# Patient Record
Sex: Female | Born: 1970 | Race: Black or African American | Hispanic: No | Smoking: Never smoker
Health system: Southern US, Community
[De-identification: ages and names within clinical notes are randomized; demographics above are authoritative.]

## PROBLEM LIST (undated history)

## (undated) DIAGNOSIS — O3440 Maternal care for other abnormalities of cervix, unspecified trimester: Secondary | ICD-10-CM

## (undated) DIAGNOSIS — IMO0002 Reserved for concepts with insufficient information to code with codable children: Secondary | ICD-10-CM

## (undated) DIAGNOSIS — Z9889 Other specified postprocedural states: Secondary | ICD-10-CM

## (undated) HISTORY — DX: Reserved for concepts with insufficient information to code with codable children: IMO0002

## (undated) HISTORY — DX: Other specified postprocedural states: Z98.890

## (undated) HISTORY — PX: EXCISION OF KELOID: SHX6267

## (undated) HISTORY — DX: Maternal care for other abnormalities of cervix, unspecified trimester: O34.40

---

## 1997-10-24 ENCOUNTER — Encounter (HOSPITAL_COMMUNITY): Admission: RE | Admit: 1997-10-24 | Discharge: 1997-11-04 | Payer: Self-pay | Admitting: *Deleted

## 1997-10-30 ENCOUNTER — Inpatient Hospital Stay (HOSPITAL_COMMUNITY): Admission: AD | Admit: 1997-10-30 | Discharge: 1997-10-30 | Payer: Self-pay | Admitting: *Deleted

## 1997-11-03 ENCOUNTER — Inpatient Hospital Stay (HOSPITAL_COMMUNITY): Admission: AD | Admit: 1997-11-03 | Discharge: 1997-11-05 | Payer: Self-pay | Admitting: *Deleted

## 2005-11-17 ENCOUNTER — Emergency Department (HOSPITAL_COMMUNITY): Admission: EM | Admit: 2005-11-17 | Discharge: 2005-11-17 | Payer: Self-pay | Admitting: Family Medicine

## 2007-01-19 ENCOUNTER — Emergency Department (HOSPITAL_COMMUNITY): Admission: EM | Admit: 2007-01-19 | Discharge: 2007-01-19 | Payer: Self-pay | Admitting: Emergency Medicine

## 2007-01-21 ENCOUNTER — Emergency Department (HOSPITAL_COMMUNITY): Admission: EM | Admit: 2007-01-21 | Discharge: 2007-01-21 | Payer: Self-pay | Admitting: Family Medicine

## 2007-05-19 ENCOUNTER — Ambulatory Visit: Payer: Self-pay | Admitting: Obstetrics & Gynecology

## 2007-06-15 DIAGNOSIS — O3440 Maternal care for other abnormalities of cervix, unspecified trimester: Secondary | ICD-10-CM

## 2007-06-15 DIAGNOSIS — Z9889 Other specified postprocedural states: Secondary | ICD-10-CM

## 2007-06-15 HISTORY — DX: Maternal care for other abnormalities of cervix, unspecified trimester: O34.40

## 2007-06-15 HISTORY — DX: Maternal care for other abnormalities of cervix, unspecified trimester: Z98.890

## 2007-07-06 ENCOUNTER — Ambulatory Visit: Payer: Self-pay | Admitting: Obstetrics and Gynecology

## 2007-07-06 ENCOUNTER — Other Ambulatory Visit: Admission: RE | Admit: 2007-07-06 | Discharge: 2007-07-06 | Payer: Self-pay | Admitting: Obstetrics & Gynecology

## 2007-08-16 ENCOUNTER — Ambulatory Visit: Payer: Self-pay | Admitting: Obstetrics & Gynecology

## 2008-08-09 ENCOUNTER — Encounter: Payer: Self-pay | Admitting: Obstetrics and Gynecology

## 2008-08-09 ENCOUNTER — Ambulatory Visit: Payer: Self-pay | Admitting: Obstetrics and Gynecology

## 2008-08-09 LAB — CONVERTED CEMR LAB
HCT: 37.8 % (ref 36.0–46.0)
MCV: 79.4 fL (ref 78.0–100.0)
Platelets: 308 10*3/uL (ref 150–400)
RBC: 4.76 M/uL (ref 3.87–5.11)
WBC: 6.4 10*3/uL (ref 4.0–10.5)

## 2008-08-15 ENCOUNTER — Ambulatory Visit (HOSPITAL_COMMUNITY): Admission: RE | Admit: 2008-08-15 | Discharge: 2008-08-15 | Payer: Self-pay | Admitting: Obstetrics and Gynecology

## 2010-04-17 ENCOUNTER — Ambulatory Visit: Payer: Self-pay | Admitting: Obstetrics & Gynecology

## 2010-07-05 ENCOUNTER — Encounter: Payer: Self-pay | Admitting: Obstetrics and Gynecology

## 2010-10-16 ENCOUNTER — Ambulatory Visit: Payer: Self-pay | Admitting: Advanced Practice Midwife

## 2010-10-27 NOTE — Group Therapy Note (Signed)
NAMEKEYUNDRA, FANT NO.:  [id number]   MEDICAL RECORD NO.:  [id number]          PATIENT TYPE:  WOC   LOCATION:  [facility]                   FACILITY:  [facility]   PHYSICIAN:  Argentina Donovan, MD        DATE OF BIRTH:  09/17/1970   DATE OF SERVICE:  07/06/2007                                  CLINIC NOTE   PROCEDURE:  Loop electrosurgical excision procedure conization of the  cervix.   PREOPERATIVE DIAGNOSIS:  Atypical Pap smear with unsatisfactory  colposcopy in a 40 year old woman, gravida 6, para 6-0-0-6.   With the patient in dorsolithotomy position, the Graves speculum was  inserted into the vagina and cervix dilated so that the cervix was in  the center of the field.  Lateral speculum was also inserted to protect  the lateral walls of the vagina which was quite redundant after 6  deliveries.  Four quadrant injections of 1 mL in each of the four  quadrants at 9, 12, 3, and 6 o'clock using 1% Xylocaine with 100,000  epinephrine was used.  A large loop was then used with a 50 blended one  current to remove the cone portion of the cervix.  The ball coagulator  was then used at 50 watts around the entire biopsy site and for  hemostasis followed by application of Monsel's solution and removal of  the speculum.  The patient tolerated the procedure well and was  discharged to home to return in 2 weeks for results.           ______________________________  Argentina Donovan, MD     PR/MEDQ  D:  07/06/2007  T:  07/06/2007  Job:  [id number]

## 2010-10-27 NOTE — Group Therapy Note (Signed)
Christine Jimenez, ROZNOWSKI NO.:  [id number]   MEDICAL RECORD NO.:  [id number]          PATIENT TYPE:  WOC   LOCATION:  [facility]                   FACILITY:  [facility]   PHYSICIAN:  Argentina Donovan, MD        DATE OF BIRTH:  10-07-1970   DATE OF SERVICE:  08/09/2008                                  CLINIC NOTE   The patient is a 40 year old gravida 6, para 6-0-0-6, the youngest child  age 30, in for her annual gynecological examination.  Complains of in  talking to her of shortness of breath.  She is also a keloid former.  She has had family history of two uncles within the last year who died  of lung cancer.   PHYSICAL EXAMINATION:  LUNGS:  Clear to auscultation and percussion.  HEART:  No murmur, normal sinus rhythm.  No gallop.  ABDOMEN:  Soft, flat, nontender.  No masses, no organomegaly.  EXTERNAL GENITALIA:  Normal.  BUS within normal limits.  Vagina is clean  and well-rugated.  Cervix clean, parous anterior lip of the cervix.  The  uterus is anterior, normal size, shape, and consistency.  Adnexa could  not be well-palpated.   IMPRESSION:  Normal gynecological examination.  The patient weighs 199  pounds, 5 feet 6 inches tall, with a blood pressure 116/72.  I am going  to get a CBC and a chest x-ray.  She sees her ENT doctor next week.  I  told her that will call her with results of these tests.  If anything is  abnormal, then refer to an internal medicine specialist.           ______________________________  Argentina Donovan, MD     PR/MEDQ  D:  08/09/2008  T:  08/09/2008  Job:  [id number]

## 2010-11-04 ENCOUNTER — Other Ambulatory Visit: Payer: Self-pay | Admitting: Family

## 2010-11-04 ENCOUNTER — Other Ambulatory Visit: Payer: Self-pay | Admitting: Obstetrics and Gynecology

## 2010-11-04 ENCOUNTER — Ambulatory Visit (INDEPENDENT_AMBULATORY_CARE_PROVIDER_SITE_OTHER): Payer: Medicaid Other | Admitting: Family Medicine

## 2010-11-04 DIAGNOSIS — Z124 Encounter for screening for malignant neoplasm of cervix: Secondary | ICD-10-CM

## 2010-11-04 DIAGNOSIS — Z1231 Encounter for screening mammogram for malignant neoplasm of breast: Secondary | ICD-10-CM

## 2010-11-05 NOTE — Group Therapy Note (Signed)
NAMELAQUINDA, MOLLER           ACCOUNT NO.:  [id number]  MEDICAL RECORD NO.:  [id number]           PATIENT TYPE:  A  LOCATION:  [facility]                   FACILITY:  [facility]  PHYSICIAN:  Sid Falcon, CNM  DATE OF BIRTH:  02-02-1971  DATE OF SERVICE:  11/04/2010                                 CLINIC NOTE  REASON FOR VISIT:  A 18-month repeat Pap smear.  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  Follow up pap in November 2011 negative, here for followup Pap.  Denies any problems or concerns at this time.  PHYSICAL EXAMINATION:  VITAL SIGNS:  Stable.  Temperature 97.7, pulse 80, blood pressure 127/82. PELVIS:  No abnormal lesions.  No abnormal discharge.  Cervix well visualized.  Pap smear obtained with broom and cytobrush, a negative cervical motion tenderness.  ASSESSMENT:  Repeat Pap smear.  PLAN:  The patient is to follow up as indicated by Pap smear results.     Sid Falcon, CNM    WM/MEDQ  D:  11/04/2010  T:  11/05/2010  Job:  [id number]

## 2010-11-23 ENCOUNTER — Ambulatory Visit (HOSPITAL_COMMUNITY): Payer: Medicaid Other

## 2011-07-07 ENCOUNTER — Ambulatory Visit (INDEPENDENT_AMBULATORY_CARE_PROVIDER_SITE_OTHER): Payer: Medicaid Other | Admitting: Physician Assistant

## 2011-07-07 ENCOUNTER — Encounter: Payer: Self-pay | Admitting: Physician Assistant

## 2011-07-07 VITALS — BP 112/80 | HR 89 | Temp 97.2°F | Resp 20 | Ht 66.0 in | Wt 204.6 lb

## 2011-07-07 DIAGNOSIS — Z87898 Personal history of other specified conditions: Secondary | ICD-10-CM | POA: Insufficient documentation

## 2011-07-07 DIAGNOSIS — Z8742 Personal history of other diseases of the female genital tract: Secondary | ICD-10-CM

## 2011-07-07 NOTE — Progress Notes (Signed)
Review of records revealed Negative excisional bx in 2009 and NIL paps since. Last pap 10/2010: NIL. No need for pap today. Will FU in May 2013 for annual and pap.

## 2011-07-20 ENCOUNTER — Other Ambulatory Visit: Payer: Self-pay | Admitting: Internal Medicine

## 2011-07-20 ENCOUNTER — Ambulatory Visit
Admission: RE | Admit: 2011-07-20 | Discharge: 2011-07-20 | Disposition: A | Discharge status: Home / Self Care | Payer: Medicaid Other | Source: Ambulatory Visit | Attending: Internal Medicine | Admitting: Internal Medicine

## 2011-07-20 DIAGNOSIS — R0602 Shortness of breath: Secondary | ICD-10-CM

## 2012-01-03 ENCOUNTER — Ambulatory Visit: Payer: Medicaid Other | Admitting: Advanced Practice Midwife

## 2012-01-26 ENCOUNTER — Ambulatory Visit (INDEPENDENT_AMBULATORY_CARE_PROVIDER_SITE_OTHER): Payer: Medicaid Other | Admitting: Advanced Practice Midwife

## 2012-01-26 ENCOUNTER — Encounter: Payer: Self-pay | Admitting: Advanced Practice Midwife

## 2012-01-26 ENCOUNTER — Other Ambulatory Visit (HOSPITAL_COMMUNITY)
Admission: RE | Admit: 2012-01-26 | Discharge: 2012-01-26 | Disposition: A | Discharge status: Home / Self Care | Payer: Medicaid Other | Source: Ambulatory Visit | Attending: Obstetrics and Gynecology | Admitting: Obstetrics and Gynecology

## 2012-01-26 VITALS — BP 113/79 | HR 86 | Temp 99.2°F | Ht 66.0 in | Wt 201.0 lb

## 2012-01-26 DIAGNOSIS — Z1151 Encounter for screening for human papillomavirus (HPV): Secondary | ICD-10-CM | POA: Insufficient documentation

## 2012-01-26 DIAGNOSIS — Z01419 Encounter for gynecological examination (general) (routine) without abnormal findings: Secondary | ICD-10-CM

## 2012-01-26 DIAGNOSIS — L910 Hypertrophic scar: Secondary | ICD-10-CM

## 2012-01-26 DIAGNOSIS — Z0000 Encounter for general adult medical examination without abnormal findings: Secondary | ICD-10-CM

## 2012-01-26 NOTE — Progress Notes (Deleted)
  Subjective:    Patient ID: Christine Jimenez, female    DOB: 07-May-1971, 41 y.o.   MRN: [id number]  HPI Pt presents for annual exam and PAP smear.    Reports skin changes over    Review of Systems     Objective:   Physical Exam        Assessment & Plan:

## 2012-01-26 NOTE — Assessment & Plan Note (Signed)
Profound over B tragus.

## 2012-01-26 NOTE — Progress Notes (Signed)
Subjective:    Christine Jimenez is a 41 y.o. female who presents for an annual exam and PAP smear.  She reports skin changes over the intertriginous areas between her breasts.  Reports no discomfort but discoloration.  She also reports worsening hearing and worsening of keloids over the tragus of her B ears.  These were previously seen by  ENT but >3 years ago.  Her hearing as worsened and reports not being able to hear conversations that are not directed directly at her.    The patient is sexually active. GYN screening history: last pap: was normal and with hx of LEEP but negative pathology. The patient  The patient participates in regular exercise: not asked. Has the patient ever been transfused or tattooed?: not asked. The patient reports that there is not domestic violence in her life.   Menstrual History: OB History    Grav Para Term Preterm Abortions TAB SAB Ect Mult Living   6 6 6       6       Patient's last menstrual period was 01/03/2012.    The following portions of the patient's history were reviewed and updated as appropriate: allergies, current medications, past family history, past medical history, past social history, past surgical history and problem list.  Review of Systems  Denies chest pain, shortness of breath, abnormal vaginal bleeding, no vision changes Otherwise PER HPI   Objective:    BP 113/79  Pulse 86  Temp 99.2 F (37.3 C) (Oral)  Ht 5\' 6"  (1.676 m)  Wt 201 lb (91.173 kg)  BMI 32.44 kg/m2  LMP 01/03/2012  General Appearance:    Alert, cooperative, no distress, appears stated age  Head:    Normocephalic, without obvious abnormality, atraumatic  Eyes:    PERRL, conjunctiva/corneas clear, EOM's intact, fundi    benign, both eyes  Ears:    Normal TM's difficult to visual due to obstructing Keloid from B tragus.  Hearing is grossly diminished but is able to differentiate between   Nose:   Nares normal, septum midline, mucosa normal, no drainage    or sinus  tenderness  Throat:   Lips, mucosa, and tongue normal; teeth and gums normal  Neck:   Supple, symmetrical, trachea midline, no adenopathy;    thyroid:  no enlargement/tenderness/nodules; no carotid   bruit or JVD  Back:     Symmetric, no curvature, ROM normal, no CVA tenderness  Lungs:     Clear to auscultation bilaterally, respirations unlabored  Chest Wall:    No tenderness or deformity   Heart:    Regular rate and rhythm, S1 and S2 normal, no murmur, rub   or gallop  Breast Exam:    No tenderness, masses, or nipple abnormality  Abdomen:     Soft, non-tender, bowel sounds active all four quadrants,    no masses, no organomegaly  Genitalia:    Normal female without lesion, discharge or tenderness, PAP collected  Rectal:    Normal tone, normal prostate, no masses or tenderness;   guaiac negative stool  Extremities:   Extremities normal, atraumatic, no cyanosis or edema  Pulses:   2+ and symmetric all extremities  Skin:   Discoloring over the intertriginous area between her breasts, consistent with Tinea Versicolor.  Multiple Keloid Scars with significant scar over manubrium.    Lymph nodes:   Cervical, supraclavicular, and axillary nodes normal  Neurologic:   CNII-XII intact, normal strength, sensation and reflexes    throughout   .  Assessment:    Healthy female exam.  1. Keloid formation 2. Tinea Versicolor   Plan:    Refer to ENT for evaluation of Keloid Selenium Sulfide + Eucerin Cream for Tinea Versicolor   Await pap smear results. Follow up as needed.   I was present for the exam and agree with above.  [place], [place] 01/26/2012

## 2012-01-26 NOTE — Patient Instructions (Signed)
It was great to see you today.  We are going to refer you back to ENT to have your ears re-evaluated and to have your hearing checked.    Remember to try the [place] Blue (Selenium Sulfide) shampoo to use over the areas of skin discoloring on your chest.  Apply in a lather and leave in place for 10 minutes.  Then rinse clean in shower.  You can use up to 7 days.  Apply a good moisturizing CREAM (in a tub/jar) such as Eucerin Cream 2-3 times per day.  Follow up in 1 year for your routine exam.  We will be in touch with you regarding the results of your PAP smear.

## 2012-05-04 ENCOUNTER — Ambulatory Visit
Admission: RE | Admit: 2012-05-04 | Discharge: 2012-05-04 | Disposition: A | Discharge status: Home / Self Care | Payer: Medicaid Other | Source: Ambulatory Visit | Attending: Internal Medicine | Admitting: Internal Medicine

## 2012-05-04 ENCOUNTER — Other Ambulatory Visit: Payer: Self-pay | Admitting: Internal Medicine

## 2012-05-04 DIAGNOSIS — M79605 Pain in left leg: Secondary | ICD-10-CM

## 2013-04-05 ENCOUNTER — Emergency Department (INDEPENDENT_AMBULATORY_CARE_PROVIDER_SITE_OTHER)
Admission: EM | Admit: 2013-04-05 | Discharge: 2013-04-05 | Disposition: A | Discharge status: Home / Self Care | Payer: Medicaid Other | Source: Home / Self Care

## 2013-04-05 ENCOUNTER — Encounter (HOSPITAL_COMMUNITY): Payer: Self-pay | Admitting: Emergency Medicine

## 2013-04-05 DIAGNOSIS — L910 Hypertrophic scar: Secondary | ICD-10-CM

## 2013-04-05 DIAGNOSIS — H6241 Otitis externa in other diseases classified elsewhere, right ear: Secondary | ICD-10-CM

## 2013-04-05 DIAGNOSIS — H60399 Other infective otitis externa, unspecified ear: Secondary | ICD-10-CM

## 2013-04-05 MED ORDER — CIPROFLOXACIN HCL 500 MG PO TABS: 500.0000 mg | ORAL_TABLET | Freq: Two times a day (BID) | ORAL | Status: DC

## 2013-04-05 MED ORDER — ACETIC ACID 2 % OT SOLN: 4.0000 [drp] | Freq: Four times a day (QID) | OTIC | Status: DC

## 2013-04-05 MED ORDER — POLYMYXIN B-TRIMETHOPRIM 10000-0.1 UNIT/ML-% OP SOLN: OPHTHALMIC | Status: DC

## 2013-04-05 NOTE — ED Notes (Signed)
Given cotton to place in right ear for drainage

## 2013-04-05 NOTE — ED Provider Notes (Signed)
CSN: [id number]     Arrival date & time 04/05/13  1326 History   First MD Initiated Contact with Patient 04/05/13 1346     Chief Complaint  Patient presents with  . Wound Infection   (Consider location/radiation/quality/duration/timing/severity/associated sxs/prior Treatment) HPI Comments: 42 year old female has 3 large keloid masses PAC together at the external auditory meatus and external ear. Occasionally it will bleed and is not unusual for her. Recently the more inferior lesion began to bleed followed by very malodorous purulent type discharge.   Past Medical History  Diagnosis Date  . 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 Hx LEEP (loop electrosurgical excision procedure), cervix, pregnancy    History reviewed. No pertinent past surgical history. Family History  Problem Relation Age of Onset  . Diabetes Mother   . Heart disease Mother   . Hypertension Mother    History  Substance Use Topics  . Smoking status: Never Smoker   . Smokeless tobacco: Not on file  . Alcohol Use: No   OB History   Grav Para Term Preterm Abortions TAB SAB Ect Mult Living   6 6 6       6      Review of Systems  Constitutional: Negative for fever, chills, activity change and fatigue.  HENT: Positive for ear discharge and ear pain. Negative for congestion, facial swelling, postnasal drip, rhinorrhea, sinus pressure, sore throat and trouble swallowing.   Respiratory: Negative.   Gastrointestinal: Negative.   All other systems reviewed and are negative.    Allergies  Review of patient's allergies indicates no known allergies.  Home Medications   Current Outpatient Rx  Name  Route  Sig  Dispense  Refill  . acetic acid (VOSOL) 2 % otic solution   Right Ear   Place 4 drops into the right ear 4 (four) times daily.   15 mL   0   . albuterol (PROVENTIL HFA;VENTOLIN HFA) 108 (90 BASE) MCG/ACT inhaler   Inhalation   Inhale 2 puffs into the lungs every 6 (six) hours as  needed. Patient unsure of dose         . ciprofloxacin (CIPRO) 500 MG tablet   Oral   Take 1 tablet (500 mg total) by mouth 2 (two) times daily.   28 tablet   0   . trimethoprim-polymyxin b (POLYTRIM) ophthalmic solution      4 drops into right ear every 4 hours   10 mL   0    BP 130/88  Pulse 98  Temp(Src) 98.7 F (37.1 C) (Oral)  Resp 18  SpO2 98%  LMP 04/03/2013 Physical Exam  Nursing note and vitals reviewed. Constitutional: She is oriented to person, place, and time. She appears well-developed and well-nourished. No distress.  HENT:  Left Ear: External ear normal.  Right TM with 3 the compacted keloid structures. The interior structure is erythematous and there is a purulent malodorous discharge originating between the middle and lower structures.  Eyes: Conjunctivae and EOM are normal.  Neck: Normal range of motion. Neck supple.  Pulmonary/Chest: Effort normal. No respiratory distress.  Neurological: She is alert and oriented to person, place, and time. She exhibits normal muscle tone.  Skin: Skin is warm and dry.    ED Course  Procedures (including critical care time) Labs Review Labs Reviewed - No data to display Imaging Review No results found.    MDM   1. Otitis externa in other diseases classified elsewhere, right ear  2. Keloid of skin      Polytrim ophthalmic drops place 4 drops in the right ear every 4 hours vosol solution 4 drops in the right ear 4 times a day Cipro 500 mg by mouth twice a day Call your ENT doctor for followup.  Hayden Rasmussen, NP 04/05/13 1440

## 2013-04-05 NOTE — ED Notes (Signed)
Patient has keloid to right ear.  Reports site was bleeding 3 days ago, now has clear drainage with an odor.

## 2013-04-05 NOTE — ED Provider Notes (Signed)
Medical screening examination/treatment/procedure(s) were performed by non-physician practitioner and as supervising physician I was immediately available for consultation/collaboration.  Leslee Home, M.D.  Reuben Likes, MD 04/05/13 [phone]

## 2013-06-28 ENCOUNTER — Other Ambulatory Visit (HOSPITAL_COMMUNITY)
Admission: RE | Admit: 2013-06-28 | Discharge: 2013-06-28 | Disposition: A | Discharge status: Home / Self Care | Payer: Medicaid Other | Source: Ambulatory Visit | Attending: Obstetrics & Gynecology | Admitting: Obstetrics & Gynecology

## 2013-06-28 ENCOUNTER — Ambulatory Visit (INDEPENDENT_AMBULATORY_CARE_PROVIDER_SITE_OTHER): Payer: Medicaid Other | Admitting: Obstetrics & Gynecology

## 2013-06-28 ENCOUNTER — Encounter: Payer: Self-pay | Admitting: Obstetrics & Gynecology

## 2013-06-28 VITALS — BP 133/72 | HR 94 | Temp 98.3°F | Ht 66.0 in | Wt 196.8 lb

## 2013-06-28 DIAGNOSIS — Z01419 Encounter for gynecological examination (general) (routine) without abnormal findings: Secondary | ICD-10-CM

## 2013-06-28 DIAGNOSIS — Z1151 Encounter for screening for human papillomavirus (HPV): Secondary | ICD-10-CM | POA: Insufficient documentation

## 2013-06-28 DIAGNOSIS — Z113 Encounter for screening for infections with a predominantly sexual mode of transmission: Secondary | ICD-10-CM | POA: Insufficient documentation

## 2013-06-28 DIAGNOSIS — Z309 Encounter for contraceptive management, unspecified: Secondary | ICD-10-CM

## 2013-06-28 NOTE — Progress Notes (Signed)
Pt has questions concerning diabetes testing, lft leg pain, feet swelling.

## 2013-06-28 NOTE — Progress Notes (Signed)
Subjective:     Patient ID: GEM CONKLE, female   DOB: April 21, 1971, 43 y.o.   MRN: [id number]  Gynecologic Exam  Patient presents for her annual exam. Patient complains of left thigh pain at night when laying on this leg. States this is an ache. Does not radiate. Only bothers her when laying down on this side. Occurs intermittently. Notes better when she gets off her left side. Denies injury to this area. States this has been improving since it began several months ago.  Patient notes she is not sexually active at this time. Last sexual activity was 3-4 months ago. She notes she walks for exercise. She denies domestic violence. Notes last period 06/18/13. Notes periods typically occur once monthly for 4-5 days with an occassional day of spotting on the 6th day.   Review of Systems denies chest pain, shortness of breath, and vision changes     Objective:   Physical Exam  Constitutional: She appears well-developed and well-nourished. No distress.  HENT:  Head: Normocephalic.  Mouth/Throat: Oropharynx is clear and moist.  Eyes: Conjunctivae are normal. Pupils are equal, round, and reactive to light.  Neck: Neck supple.  Cardiovascular: Normal rate, regular rhythm and normal heart sounds.   Pulmonary/Chest: Effort normal and breath sounds normal.  Abdominal: Soft. She exhibits no distension. There is no tenderness.  Genitourinary:  Normal external vagina, normal vaginal mucosa, no cervical abnormalities noted, no masses felt on bimanual exam  Musculoskeletal: She exhibits no edema.  Left thigh without swelling or erythema, no tenderness to palpation, full range of motion in LE  Lymphadenopathy:    She has no cervical adenopathy.  Neurological: She is alert.  Skin: Skin is warm and dry.  BP 133/72  Pulse 94  Temp(Src) 98.3 F (36.8 C)  Ht 5\' 6"  (1.676 m)  Wt 196 lb 12.8 oz (89.268 kg)  BMI 31.78 kg/m2  LMP 06/18/2013      Assessment:     Well Woman exam Left thigh pain     Plan:     Will f/u pap smear results. Thigh pain likely MSK related, though with no pain now and improving. Provided reassurance and to follow-up as needed for this issue.

## 2013-06-28 NOTE — Progress Notes (Signed)
Patient ID: Christine Jimenez, female   DOB: 04-Aug-1970, 43 y.o.   MRN: [id number] Attestation of Attending Supervision of Resident: Evaluation and management procedures were performed by the [facility] Resident under my supervision.  I have performed the exam on the patient as documented by the resident.  I have reviewed his note and chart, and I agree with the management and plan.  Lasandra Beech, M.D. 06/28/2013 3:10 PM

## 2013-06-28 NOTE — Patient Instructions (Signed)
Pap Test A Pap test checks the cells on the surface of your cervix. Your doctor will look for cell changes that are not normal, an infection, or cancer. If the cells no longer look normal, it is called dysplasia. Dysplasia can turn into cancer. Regular Pap tests are important to stop cancer from developing. BEFORE THE PROCEDURE  Ask your doctor when to schedule your Pap test. Timing the test around your period may be important.  Do not douche or have sex (intercourse) for 24 hours before the test.  Do not put creams on your vagina or use tampons for 24 hours before the test.  Go pee (urinate) just before the test. PROCEDURE  You will lie on an exam table with your feet in stirrups.  A warm metal or plastic tool (speculum) will be put in your vagina to open it up.  Your doctor will use a small, plastic brush or wooden spatula to take cells from your cervix.  The cells will be put in a lab container.  The cells will be checked under a microscope to see if they are normal or not. AFTER THE PROCEDURE Get your test results. If they are abnormal, you may need more tests. Document Released: 07/03/2010 Document Revised: 08/23/2011 Document Reviewed: 05/27/2011 Exit[employer] Patient Information 2014 [employer].  

## 2013-07-16 ENCOUNTER — Encounter: Payer: Self-pay | Admitting: *Deleted

## 2013-07-19 ENCOUNTER — Other Ambulatory Visit: Payer: Self-pay | Admitting: Obstetrics & Gynecology

## 2013-07-19 DIAGNOSIS — Z1231 Encounter for screening mammogram for malignant neoplasm of breast: Secondary | ICD-10-CM

## 2013-07-31 ENCOUNTER — Ambulatory Visit (HOSPITAL_COMMUNITY): Payer: Medicaid Other

## 2013-08-07 ENCOUNTER — Ambulatory Visit (HOSPITAL_COMMUNITY): Payer: Medicaid Other | Attending: Obstetrics & Gynecology

## 2013-10-23 ENCOUNTER — Ambulatory Visit (HOSPITAL_COMMUNITY)
Admission: RE | Admit: 2013-10-23 | Discharge: 2013-10-23 | Disposition: A | Discharge status: Home / Self Care | Payer: Medicaid Other | Source: Ambulatory Visit | Attending: Obstetrics & Gynecology | Admitting: Obstetrics & Gynecology

## 2013-10-23 DIAGNOSIS — Z1231 Encounter for screening mammogram for malignant neoplasm of breast: Secondary | ICD-10-CM

## 2014-04-15 ENCOUNTER — Encounter: Payer: Self-pay | Admitting: Obstetrics & Gynecology

## 2014-08-12 ENCOUNTER — Ambulatory Visit: Payer: Medicaid Other | Admitting: Obstetrics & Gynecology

## 2014-09-11 ENCOUNTER — Encounter: Payer: Self-pay | Admitting: Obstetrics & Gynecology

## 2014-09-11 ENCOUNTER — Other Ambulatory Visit (HOSPITAL_COMMUNITY)
Admission: RE | Admit: 2014-09-11 | Discharge: 2014-09-11 | Disposition: A | Discharge status: Home / Self Care | Payer: Medicaid Other | Source: Ambulatory Visit | Attending: Obstetrics & Gynecology | Admitting: Obstetrics & Gynecology

## 2014-09-11 ENCOUNTER — Ambulatory Visit (INDEPENDENT_AMBULATORY_CARE_PROVIDER_SITE_OTHER): Payer: Medicaid Other | Admitting: Obstetrics & Gynecology

## 2014-09-11 VITALS — BP 128/75 | HR 85 | Temp 99.2°F | Wt 202.9 lb

## 2014-09-11 DIAGNOSIS — Z3009 Encounter for other general counseling and advice on contraception: Secondary | ICD-10-CM

## 2014-09-11 DIAGNOSIS — Z01419 Encounter for gynecological examination (general) (routine) without abnormal findings: Secondary | ICD-10-CM | POA: Insufficient documentation

## 2014-09-11 DIAGNOSIS — Z1151 Encounter for screening for human papillomavirus (HPV): Secondary | ICD-10-CM | POA: Diagnosis not present

## 2014-09-11 DIAGNOSIS — Z124 Encounter for screening for malignant neoplasm of cervix: Secondary | ICD-10-CM

## 2014-09-11 NOTE — Progress Notes (Signed)
GYNECOLOGY CLINIC ANNUAL PREVENTATIVE CARE ENCOUNTER NOTE  Subjective:     Christine Jimenez is a 44 y.o. [id number] female here for a routine annual gynecologic exam and contraceptive counseling.  Current complaints: no GYN concerns.  Uses condoms as needed, not currently sexually active as she just ended a relationship.     Gynecologic History Patient's last menstrual period was 08/20/2014. Contraception: none Last Pap: 06/28/2013. Results were: normal Last mammogram: 10/23/13. Results were: normal  Obstetric History OB History  Gravida Para Term Preterm AB SAB TAB Ectopic Multiple Living  6 6 6       6     # Outcome Date GA Lbr Len/2nd Weight Sex Delivery Anes PTL Lv  6 Term           5 Term           4 Term           3 Term           2 Term           1 Term               History   Social History  . Marital Status: Divorced    Spouse Name: N/A  . Number of Children: N/A  . Years of Education: N/A   Occupational History  . Not on file.   Social History Main Topics  . Smoking status: Never Smoker   . Smokeless tobacco: Never Used  . Alcohol Use: No  . Drug Use: No  . Sexual Activity: No   Other Topics Concern  . Not on file   Social History Narrative    The following portions of the patient's history were reviewed and updated as appropriate: allergies, current medications, past family history, past medical history, past social history, past surgical history and problem list.  Review of Systems A comprehensive review of systems was negative except for: Ears, nose, mouth, throat, and face: positive for earaches and keloids   Objective:   BP 128/75 mmHg  Pulse 85  Temp(Src) 99.2 F (37.3 C) (Oral)  Wt 202 lb 14.4 oz (92.035 kg)  LMP 08/20/2014 GENERAL: Well-developed, well-nourished female in no acute distress.  HEENT: Normocephalic, atraumatic. Sclerae anicteric.  NECK: Supple. Normal thyroid.  LUNGS: Clear to auscultation bilaterally.  HEART: Regular  rate and rhythm. BREASTS: Symmetric in size. No masses, skin changes, nipple drainage, or lymphadenopathy. ABDOMEN: Soft, nontender, nondistended. No organomegaly. PELVIC: Normal external female genitalia. Vagina is pink and rugated.  Normal discharge. Normal cervix contour. Pap smear obtained. Uterus is normal in size. No adnexal mass or tenderness.  EXTREMITIES: No cyanosis, clubbing, or edema, 2+ distal pulses.   Assessment:   Annual gynecologic examination Contraceptive counseling   Plan:   Pap done, will follow up results and manage accordingly. Reviewed all forms of birth control options available including abstinence; over the counter/barrier methods; hormonal contraceptive medication including pill, patch, ring, injection,contraceptive implant; hormonal and nonhormonal IUDs; permanent sterilization options including vasectomy and the various tubal sterilization modalities. Risks and benefits reviewed.  Questions were answered.  Information was given to patient to review.  Patient will use condoms as needed for now for contraception and STI prevention. Mammogram scholarship application filled out by patient; they will call her to schedule study. Will follow up with PCP/ENT for ear issues Routine preventative health maintenance measures emphasized   Verita Schneiders, MD, [place] Attending [place] for [place] [place]  [facility]

## 2014-09-11 NOTE — Patient Instructions (Signed)
Contraception Choices Contraception (birth control) is the use of any methods or devices to prevent pregnancy. Below are some methods to help avoid pregnancy. HORMONAL METHODS   Contraceptive implant. This is a thin, plastic tube containing progesterone hormone. It does not contain estrogen hormone. Your health care provider inserts the tube in the inner part of the upper arm. The tube can remain in place for up to 3 years. After 3 years, the implant must be removed. The implant prevents the ovaries from releasing an egg (ovulation), thickens the cervical mucus to prevent sperm from entering the uterus, and thins the lining of the inside of the uterus.  Progesterone-only injections. These injections are given every 3 months by your health care provider to prevent pregnancy. This synthetic progesterone hormone stops the ovaries from releasing eggs. It also thickens cervical mucus and changes the uterine lining. This makes it harder for sperm to survive in the uterus.  Birth control pills. These pills contain estrogen and progesterone hormone. They work by preventing the ovaries from releasing eggs (ovulation). They also cause the cervical mucus to thicken, preventing the sperm from entering the uterus. Birth control pills are prescribed by a health care provider.Birth control pills can also be used to treat heavy periods.  Minipill. This type of birth control pill contains only the progesterone hormone. They are taken every day of each month and must be prescribed by your health care provider.  Birth control patch. The patch contains hormones similar to those in birth control pills. It must be changed once a week and is prescribed by a health care provider.  Vaginal ring. The ring contains hormones similar to those in birth control pills. It is left in the vagina for 3 weeks, removed for 1 week, and then a new one is put back in place. The patient must be comfortable inserting and removing the ring  from the vagina.A health care provider's prescription is necessary.  Emergency contraception. Emergency contraceptives prevent pregnancy after unprotected sexual intercourse. This pill can be taken right after sex or up to 5 days after unprotected sex. It is most effective the sooner you take the pills after having sexual intercourse. Most emergency contraceptive pills are available without a prescription. Check with your pharmacist. Do not use emergency contraception as your only form of birth control. BARRIER METHODS   Female condom. This is a thin sheath (latex or rubber) that is worn over the penis during sexual intercourse. It can be used with spermicide to increase effectiveness.  Female condom. This is a soft, loose-fitting sheath that is put into the vagina before sexual intercourse.  Diaphragm. This is a soft, latex, dome-shaped barrier that must be fitted by a health care provider. It is inserted into the vagina, along with a spermicidal jelly. It is inserted before intercourse. The diaphragm should be left in the vagina for 6 to 8 hours after intercourse.  Cervical cap. This is a round, soft, latex or plastic cup that fits over the cervix and must be fitted by a health care provider. The cap can be left in place for up to 48 hours after intercourse.  Sponge. This is a soft, circular piece of polyurethane foam. The sponge has spermicide in it. It is inserted into the vagina after wetting it and before sexual intercourse.  Spermicides. These are chemicals that kill or block sperm from entering the cervix and uterus. They come in the form of creams, jellies, suppositories, foam, or tablets. They do not require a   prescription. They are inserted into the vagina with an applicator before having sexual intercourse. The process must be repeated every time you have sexual intercourse. INTRAUTERINE CONTRACEPTION  Intrauterine device (IUD). This is a T-shaped device that is put in a woman's uterus  during a menstrual period to prevent pregnancy. There are 2 types:  Copper IUD. This type of IUD is wrapped in copper wire and is placed inside the uterus. Copper makes the uterus and fallopian tubes produce a fluid that kills sperm. It can stay in place for 10 years.  Hormone IUD. This type of IUD contains the hormone progestin (synthetic progesterone). The hormone thickens the cervical mucus and prevents sperm from entering the uterus, and it also thins the uterine lining to prevent implantation of a fertilized egg. The hormone can weaken or kill the sperm that get into the uterus. It can stay in place for 3-5 years, depending on which type of IUD is used. PERMANENT METHODS OF CONTRACEPTION  Female tubal ligation. This is when the woman's fallopian tubes are surgically sealed, tied, or blocked to prevent the egg from traveling to the uterus.  Hysteroscopic sterilization. This involves placing a small coil or insert into each fallopian tube. Your doctor uses a technique called hysteroscopy to do the procedure. The device causes scar tissue to form. This results in permanent blockage of the fallopian tubes, so the sperm cannot fertilize the egg. It takes about 3 months after the procedure for the tubes to become blocked. You must use another form of birth control for these 3 months.  Female sterilization. This is when the female has the tubes that carry sperm tied off (vasectomy).This blocks sperm from entering the vagina during sexual intercourse. After the procedure, the man can still ejaculate fluid (semen). NATURAL PLANNING METHODS  Natural family planning. This is not having sexual intercourse or using a barrier method (condom, diaphragm, cervical cap) on days the woman could become pregnant.  Calendar method. This is keeping track of the length of each menstrual cycle and identifying when you are fertile.  Ovulation method. This is avoiding sexual intercourse during ovulation.  Symptothermal  method. This is avoiding sexual intercourse during ovulation, using a thermometer and ovulation symptoms.  Post-ovulation method. This is timing sexual intercourse after you have ovulated. Regardless of which type or method of contraception you choose, it is important that you use condoms to protect against the transmission of sexually transmitted infections (STIs). Talk with your health care provider about which form of contraception is most appropriate for you. Document Released: 05/31/2005 Document Revised: 06/05/2013 Document Reviewed: 11/23/2012 [facility] Patient Information 2015 [place], [place]. This information is not intended to replace advice given to you by your health care provider. Make sure you discuss any questions you have with your health care provider.   Preventive Care for Adults A healthy lifestyle and preventive care can promote health and wellness. Preventive health guidelines for women include the following key practices.  A routine yearly physical is a good way to check with your health care provider about your health and preventive screening. It is a chance to share any concerns and updates on your health and to receive a thorough exam.  Visit your dentist for a routine exam and preventive care every 6 months. Brush your teeth twice a day and floss once a day. Good oral hygiene prevents tooth decay and gum disease.  The frequency of eye exams is based on your age, health, family medical history, use of contact lenses,  and other factors. Follow your health care provider's recommendations for frequency of eye exams.  Eat a healthy diet. Foods like vegetables, fruits, whole grains, low-fat dairy products, and lean protein foods contain the nutrients you need without too many calories. Decrease your intake of foods high in solid fats, added sugars, and salt. Eat the right amount of calories for you.Get information about a proper diet from your health care provider, if  necessary.  Regular physical exercise is one of the most important things you can do for your health. Most adults should get at least 150 minutes of moderate-intensity exercise (any activity that increases your heart rate and causes you to sweat) each week. In addition, most adults need muscle-strengthening exercises on 2 or more days a week.  Maintain a healthy weight. The body mass index (BMI) is a screening tool to identify possible weight problems. It provides an estimate of body fat based on height and weight. Your health care provider can find your BMI and can help you achieve or maintain a healthy weight.For adults 20 years and older:  A BMI below 18.5 is considered underweight.  A BMI of 18.5 to 24.9 is normal.  A BMI of 25 to 29.9 is considered overweight.  A BMI of 30 and above is considered obese.  Maintain normal blood lipids and cholesterol levels by exercising and minimizing your intake of saturated fat. Eat a balanced diet with plenty of fruit and vegetables. Blood tests for lipids and cholesterol should begin at age 18 and be repeated every 5 years. If your lipid or cholesterol levels are high, you are over 50, or you are at high risk for heart disease, you may need your cholesterol levels checked more frequently.Ongoing high lipid and cholesterol levels should be treated with medicines if diet and exercise are not working.  If you smoke, find out from your health care provider how to quit. If you do not use tobacco, do not start.  Lung cancer screening is recommended for adults aged 51-80 years who are at high risk for developing lung cancer because of a history of smoking. A yearly low-dose CT scan of the lungs is recommended for people who have at least a 30-pack-year history of smoking and are a current smoker or have quit within the past 15 years. A pack year of smoking is smoking an average of 1 pack of cigarettes a day for 1 year (for example: 1 pack a day for 30 years or 2  packs a day for 15 years). Yearly screening should continue until the smoker has stopped smoking for at least 15 years. Yearly screening should be stopped for people who develop a health problem that would prevent them from having lung cancer treatment.  If you are pregnant, do not drink alcohol. If you are breastfeeding, be very cautious about drinking alcohol. If you are not pregnant and choose to drink alcohol, do not have more than 1 drink per day. One drink is considered to be 12 ounces (355 mL) of beer, 5 ounces (148 mL) of wine, or 1.5 ounces (44 mL) of liquor.  Avoid use of street drugs. Do not share needles with anyone. Ask for help if you need support or instructions about stopping the use of drugs.  High blood pressure causes heart disease and increases the risk of stroke. Your blood pressure should be checked at least every 1 to 2 years. Ongoing high blood pressure should be treated with medicines if weight loss and exercise do  If you are 55-79 years old, ask your health care provider if you should take aspirin to prevent strokes.  Diabetes screening involves taking a blood sample to check your fasting blood sugar level. This should be done once every 3 years, after age 45, if you are within normal weight and without risk factors for diabetes. Testing should be considered at a younger age or be carried out more frequently if you are overweight and have at least 1 risk factor for diabetes.  Breast cancer screening is essential preventive care for women. You should practice "breast self-awareness." This means understanding the normal appearance and feel of your breasts and may include breast self-examination. Any changes detected, no matter how small, should be reported to a health care provider. Women in their 20s and 30s should have a clinical breast exam (CBE) by a health care provider as part of a regular health exam every 1 to 3 years. After age 40, women should have a CBE every  year. Starting at age 40, women should consider having a mammogram (breast X-ray test) every year. Women who have a family history of breast cancer should talk to their health care provider about genetic screening. Women at a high risk of breast cancer should talk to their health care providers about having an MRI and a mammogram every year.  Breast cancer gene (BRCA)-related cancer risk assessment is recommended for women who have family members with BRCA-related cancers. BRCA-related cancers include breast, ovarian, tubal, and peritoneal cancers. Having family members with these cancers may be associated with an increased risk for harmful changes (mutations) in the breast cancer genes BRCA1 and BRCA2. Results of the assessment will determine the need for genetic counseling and BRCA1 and BRCA2 testing.  Routine pelvic exams to screen for cancer are no longer recommended for nonpregnant women who are considered low risk for cancer of the pelvic organs (ovaries, uterus, and vagina) and who do not have symptoms. Ask your health care provider if a screening pelvic exam is right for you.  If you have had past treatment for cervical cancer or a condition that could lead to cancer, you need Pap tests and screening for cancer for at least 20 years after your treatment. If Pap tests have been discontinued, your risk factors (such as having a new sexual partner) need to be reassessed to determine if screening should be resumed. Some women have medical problems that increase the chance of getting cervical cancer. In these cases, your health care provider may recommend more frequent screening and Pap tests.  The HPV test is an additional test that may be used for cervical cancer screening. The HPV test looks for the virus that can cause the cell changes on the cervix. The cells collected during the Pap test can be tested for HPV. The HPV test could be used to screen women aged 30 years and older, and should be used in  women of any age who have unclear Pap test results. After the age of 30, women should have HPV testing at the same frequency as a Pap test.  Colorectal cancer can be detected and often prevented. Most routine colorectal cancer screening begins at the age of 50 years and continues through age 75 years. However, your health care provider may recommend screening at an earlier age if you have risk factors for colon cancer. On a yearly basis, your health care provider may provide home test kits to check for hidden blood in the stool. Use of a   Use of a small camera at the end of a tube, to directly examine the colon (sigmoidoscopy or colonoscopy), can detect the earliest forms of colorectal cancer. Talk to your health care provider about this at age 79, when routine screening begins. Direct exam of the colon should be repeated every 5-10 years through age 39 years, unless early forms of pre-cancerous polyps or small growths are found.  People who are at an increased risk for hepatitis B should be screened for this virus. You are considered at high risk for hepatitis B if:  You were born in a country where hepatitis B occurs often. Talk with your health care provider about which countries are considered high risk.  Your parents were born in a high-risk country and you have not received a shot to protect against hepatitis B (hepatitis B vaccine).  You have HIV or AIDS.  You use needles to inject street drugs.  You live with, or have sex with, someone who has hepatitis B.  You get hemodialysis treatment.  You take certain medicines for conditions like cancer, organ transplantation, and autoimmune conditions.  Hepatitis C blood testing is recommended for all people born from [birth year] through 1966 and any individual with known risks for hepatitis C.  Practice safe sex. Use condoms and avoid high-risk sexual practices to reduce the spread of sexually transmitted infections (STIs). STIs include gonorrhea, chlamydia,  syphilis, trichomonas, herpes, HPV, and human immunodeficiency virus (HIV). Herpes, HIV, and HPV are viral illnesses that have no cure. They can result in disability, cancer, and death.  You should be screened for sexually transmitted illnesses (STIs) including gonorrhea and chlamydia if:  You are sexually active and are younger than 24 years.  You are older than 24 years and your health care provider tells you that you are at risk for this type of infection.  Your sexual activity has changed since you were last screened and you are at an increased risk for chlamydia or gonorrhea. Ask your health care provider if you are at risk.  If you are at risk of being infected with HIV, it is recommended that you take a prescription medicine daily to prevent HIV infection. This is called preexposure prophylaxis (PrEP). You are considered at risk if:  You are a heterosexual woman, are sexually active, and are at increased risk for HIV infection.  You take drugs by injection.  You are sexually active with a partner who has HIV.  Talk with your health care provider about whether you are at high risk of being infected with HIV. If you choose to begin PrEP, you should first be tested for HIV. You should then be tested every 3 months for as long as you are taking PrEP.  Osteoporosis is a disease in which the bones lose minerals and strength with aging. This can result in serious bone fractures or breaks. The risk of osteoporosis can be identified using a bone density scan. Women ages 69 years and over and women at risk for fractures or osteoporosis should discuss screening with their health care providers. Ask your health care provider whether you should take a calcium supplement or vitamin D to reduce the rate of osteoporosis.  Menopause can be associated with physical symptoms and risks. Hormone replacement therapy is available to decrease symptoms and risks. You should talk to your health care provider  about whether hormone replacement therapy is right for you.  Use sunscreen. Apply sunscreen liberally and repeatedly throughout the day. You should seek shade  is shorter than you. Protect yourself by wearing long sleeves, pants, a wide-brimmed hat, and sunglasses year round, whenever you are outdoors.  Once a month, do a whole body skin exam, using a mirror to look at the skin on your back. Tell your health care provider of new moles, moles that have irregular borders, moles that are larger than a pencil eraser, or moles that have changed in shape or color.  Stay current with required vaccines (immunizations).  Influenza vaccine. All adults should be immunized every year.  Tetanus, diphtheria, and acellular pertussis (Td, Tdap) vaccine. Pregnant women should receive 1 dose of Tdap vaccine during each pregnancy. The dose should be obtained regardless of the length of time since the last dose. Immunization is preferred during the 27th-36th week of gestation. An adult who has not previously received Tdap or who does not know her vaccine status should receive 1 dose of Tdap. This initial dose should be followed by tetanus and diphtheria toxoids (Td) booster doses every 10 years. Adults with an unknown or incomplete history of completing a 3-dose immunization series with Td-containing vaccines should begin or complete a primary immunization series including a Tdap dose. Adults should receive a Td booster every 10 years.  Varicella vaccine. An adult without evidence of immunity to varicella should receive 2 doses or a second dose if she has previously received 1 dose. Pregnant females who do not have evidence of immunity should receive the first dose after pregnancy. This first dose should be obtained before leaving the health care facility. The second dose should be obtained 4-8 weeks after the first dose.  Human papillomavirus (HPV) vaccine. Females aged 13-26 years who have not received  the vaccine previously should obtain the 3-dose series. The vaccine is not recommended for use in pregnant females. However, pregnancy testing is not needed before receiving a dose. If a female is found to be pregnant after receiving a dose, no treatment is needed. In that case, the remaining doses should be delayed until after the pregnancy. Immunization is recommended for any person with an immunocompromised condition through the age of 26 years if she did not get any or all doses earlier. During the 3-dose series, the second dose should be obtained 4-8 weeks after the first dose. The third dose should be obtained 24 weeks after the first dose and 16 weeks after the second dose.  Zoster vaccine. One dose is recommended for adults aged 60 years or older unless certain conditions are present.  Measles, mumps, and rubella (MMR) vaccine. Adults born before 1957 generally are considered immune to measles and mumps. Adults born in 1957 or later should have 1 or more doses of MMR vaccine unless there is a contraindication to the vaccine or there is laboratory evidence of immunity to each of the three diseases. A routine second dose of MMR vaccine should be obtained at least 28 days after the first dose for students attending postsecondary schools, health care workers, or international travelers. People who received inactivated measles vaccine or an unknown type of measles vaccine during 1963-1967 should receive 2 doses of MMR vaccine. People who received inactivated mumps vaccine or an unknown type of mumps vaccine before 1979 and are at high risk for mumps infection should consider immunization with 2 doses of MMR vaccine. For females of childbearing age, rubella immunity should be determined. If there is no evidence of immunity, females who are not pregnant should be vaccinated. If there is no evidence of immunity, females who   are pregnant should delay immunization until after pregnancy. Unvaccinated health care  workers born before 1957 who lack laboratory evidence of measles, mumps, or rubella immunity or laboratory confirmation of disease should consider measles and mumps immunization with 2 doses of MMR vaccine or rubella immunization with 1 dose of MMR vaccine.  Pneumococcal 13-valent conjugate (PCV13) vaccine. When indicated, a person who is uncertain of her immunization history and has no record of immunization should receive the PCV13 vaccine. An adult aged 19 years or older who has certain medical conditions and has not been previously immunized should receive 1 dose of PCV13 vaccine. This PCV13 should be followed with a dose of pneumococcal polysaccharide (PPSV23) vaccine. The PPSV23 vaccine dose should be obtained at least 8 weeks after the dose of PCV13 vaccine. An adult aged 19 years or older who has certain medical conditions and previously received 1 or more doses of PPSV23 vaccine should receive 1 dose of PCV13. The PCV13 vaccine dose should be obtained 1 or more years after the last PPSV23 vaccine dose.  Pneumococcal polysaccharide (PPSV23) vaccine. When PCV13 is also indicated, PCV13 should be obtained first. All adults aged 65 years and older should be immunized. An adult younger than age 65 years who has certain medical conditions should be immunized. Any person who resides in a nursing home or long-term care facility should be immunized. An adult smoker should be immunized. People with an immunocompromised condition and certain other conditions should receive both PCV13 and PPSV23 vaccines. People with human immunodeficiency virus (HIV) infection should be immunized as soon as possible after diagnosis. Immunization during chemotherapy or radiation therapy should be avoided. Routine use of PPSV23 vaccine is not recommended for American Indians, [place] Natives, or people younger than 65 years unless there are medical conditions that require PPSV23 vaccine. When indicated, people who have unknown  immunization and have no record of immunization should receive PPSV23 vaccine. One-time revaccination 5 years after the first dose of PPSV23 is recommended for people aged 19-64 years who have chronic kidney failure, nephrotic syndrome, asplenia, or immunocompromised conditions. People who received 1-2 doses of PPSV23 before age 65 years should receive another dose of PPSV23 vaccine at age 65 years or later if at least 5 years have passed since the previous dose. Doses of PPSV23 are not needed for people immunized with PPSV23 at or after age 65 years.  Meningococcal vaccine. Adults with asplenia or persistent complement component deficiencies should receive 2 doses of quadrivalent meningococcal conjugate (MenACWY-D) vaccine. The doses should be obtained at least 2 months apart. Microbiologists working with certain meningococcal bacteria, military recruits, people at risk during an outbreak, and people who travel to or live in countries with a high rate of meningitis should be immunized. A first-year college student up through age 21 years who is living in a residence hall should receive a dose if she did not receive a dose on or after her 16th birthday. Adults who have certain high-risk conditions should receive one or more doses of vaccine.  Hepatitis A vaccine. Adults who wish to be protected from this disease, have certain high-risk conditions, work with hepatitis A-infected animals, work in hepatitis A research labs, or travel to or work in countries with a high rate of hepatitis A should be immunized. Adults who were previously unvaccinated and who anticipate close contact with an international adoptee during the first 60 days after arrival in the [place] from a country with a high rate of hepatitis A should be   immunized.  Hepatitis B vaccine. Adults who wish to be protected from this disease, have certain high-risk conditions, may be exposed to blood or other infectious body fluids, are household  contacts or sex partners of hepatitis B positive people, are clients or workers in certain care facilities, or travel to or work in countries with a high rate of hepatitis B should be immunized.  Haemophilus influenzae type b (Hib) vaccine. A previously unvaccinated person with asplenia or sickle cell disease or having a scheduled splenectomy should receive 1 dose of Hib vaccine. Regardless of previous immunization, a recipient of a hematopoietic stem cell transplant should receive a 3-dose series 6-12 months after her successful transplant. Hib vaccine is not recommended for adults with HIV infection. Preventive Services / Frequency Ages 19 to 39 years  Blood pressure check.** / Every 1 to 2 years.  Lipid and cholesterol check.** / Every 5 years beginning at age 20.  Clinical breast exam.** / Every 3 years for women in their 20s and 30s.  BRCA-related cancer risk assessment.** / For women who have family members with a BRCA-related cancer (breast, ovarian, tubal, or peritoneal cancers).  Pap test.** / Every 2 years from ages 21 through 29. Every 3 years starting at age 30 through age 65 or 70 with a history of 3 consecutive normal Pap tests.  HPV screening.** / Every 3 years from ages 30 through ages 65 to 70 with a history of 3 consecutive normal Pap tests.  Hepatitis C blood test.** / For any individual with known risks for hepatitis C.  Skin self-exam. / Monthly.  Influenza vaccine. / Every year.  Tetanus, diphtheria, and acellular pertussis (Tdap, Td) vaccine.** / Consult your health care provider. Pregnant women should receive 1 dose of Tdap vaccine during each pregnancy. 1 dose of Td every 10 years.  Varicella vaccine.** / Consult your health care provider. Pregnant females who do not have evidence of immunity should receive the first dose after pregnancy.  HPV vaccine. / 3 doses over 6 months, if 26 and younger. The vaccine is not recommended for use in pregnant females. However,  pregnancy testing is not needed before receiving a dose.  Measles, mumps, rubella (MMR) vaccine.** / You need at least 1 dose of MMR if you were born in 1957 or later. You may also need a 2nd dose. For females of childbearing age, rubella immunity should be determined. If there is no evidence of immunity, females who are not pregnant should be vaccinated. If there is no evidence of immunity, females who are pregnant should delay immunization until after pregnancy.  Pneumococcal 13-valent conjugate (PCV13) vaccine.** / Consult your health care provider.  Pneumococcal polysaccharide (PPSV23) vaccine.** / 1 to 2 doses if you smoke cigarettes or if you have certain conditions.  Meningococcal vaccine.** / 1 dose if you are age 19 to 21 years and a first-year college student living in a residence hall, or have one of several medical conditions, you need to get vaccinated against meningococcal disease. You may also need additional booster doses.  Hepatitis A vaccine.** / Consult your health care provider.  Hepatitis B vaccine.** / Consult your health care provider.  Haemophilus influenzae type b (Hib) vaccine.** / Consult your health care provider. Ages 40 to 64 years  Blood pressure check.** / Every 1 to 2 years.  Lipid and cholesterol check.** / Every 5 years beginning at age 20 years.  Lung cancer screening. / Every year if you are aged 55-80 years and have a   30-pack-year history of smoking and currently smoke or have quit within the past 15 years. Yearly screening is stopped once you have quit smoking for at least 15 years or develop a health problem that would prevent you from having lung cancer treatment.  Clinical breast exam.** / Every year after age 40 years.  BRCA-related cancer risk assessment.** / For women who have family members with a BRCA-related cancer (breast, ovarian, tubal, or peritoneal cancers).  Mammogram.** / Every year beginning at age 40 years and continuing for as long  as you are in good health. Consult with your health care provider.  Pap test.** / Every 3 years starting at age 30 years through age 65 or 70 years with a history of 3 consecutive normal Pap tests.  HPV screening.** / Every 3 years from ages 30 years through ages 65 to 70 years with a history of 3 consecutive normal Pap tests.  Fecal occult blood test (FOBT) of stool. / Every year beginning at age 50 years and continuing until age 75 years. You may not need to do this test if you get a colonoscopy every 10 years.  Flexible sigmoidoscopy or colonoscopy.** / Every 5 years for a flexible sigmoidoscopy or every 10 years for a colonoscopy beginning at age 50 years and continuing until age 75 years.  Hepatitis C blood test.** / For all people born from 1945 through 1965 and any individual with known risks for hepatitis C.  Skin self-exam. / Monthly.  Influenza vaccine. / Every year.  Tetanus, diphtheria, and acellular pertussis (Tdap/Td) vaccine.** / Consult your health care provider. Pregnant women should receive 1 dose of Tdap vaccine during each pregnancy. 1 dose of Td every 10 years.  Varicella vaccine.** / Consult your health care provider. Pregnant females who do not have evidence of immunity should receive the first dose after pregnancy.  Zoster vaccine.** / 1 dose for adults aged 60 years or older.  Measles, mumps, rubella (MMR) vaccine.** / You need at least 1 dose of MMR if you were born in 1957 or later. You may also need a 2nd dose. For females of childbearing age, rubella immunity should be determined. If there is no evidence of immunity, females who are not pregnant should be vaccinated. If there is no evidence of immunity, females who are pregnant should delay immunization until after pregnancy.  Pneumococcal 13-valent conjugate (PCV13) vaccine.** / Consult your health care provider.  Pneumococcal polysaccharide (PPSV23) vaccine.** / 1 to 2 doses if you smoke cigarettes or if you  have certain conditions.  Meningococcal vaccine.** / Consult your health care provider.  Hepatitis A vaccine.** / Consult your health care provider.  Hepatitis B vaccine.** / Consult your health care provider.  Haemophilus influenzae type b (Hib) vaccine.** / Consult your health care provider. Ages 65 years and over  Blood pressure check.** / Every 1 to 2 years.  Lipid and cholesterol check.** / Every 5 years beginning at age 20 years.  Lung cancer screening. / Every year if you are aged 55-80 years and have a 30-pack-year history of smoking and currently smoke or have quit within the past 15 years. Yearly screening is stopped once you have quit smoking for at least 15 years or develop a health problem that would prevent you from having lung cancer treatment.  Clinical breast exam.** / Every year after age 40 years.  BRCA-related cancer risk assessment.** / For women who have family members with a BRCA-related cancer (breast, ovarian, tubal, or peritoneal cancers).    Mammogram.** / Every year beginning at age 40 years and continuing for as long as you are in good health. Consult with your health care provider.  Pap test.** / Every 3 years starting at age 30 years through age 65 or 70 years with 3 consecutive normal Pap tests. Testing can be stopped between 65 and 70 years with 3 consecutive normal Pap tests and no abnormal Pap or HPV tests in the past 10 years.  HPV screening.** / Every 3 years from ages 30 years through ages 65 or 70 years with a history of 3 consecutive normal Pap tests. Testing can be stopped between 65 and 70 years with 3 consecutive normal Pap tests and no abnormal Pap or HPV tests in the past 10 years.  Fecal occult blood test (FOBT) of stool. / Every year beginning at age 50 years and continuing until age 75 years. You may not need to do this test if you get a colonoscopy every 10 years.  Flexible sigmoidoscopy or colonoscopy.** / Every 5 years for a flexible  sigmoidoscopy or every 10 years for a colonoscopy beginning at age 50 years and continuing until age 75 years.  Hepatitis C blood test.** / For all people born from 1945 through 1965 and any individual with known risks for hepatitis C.  Osteoporosis screening.** / A one-time screening for women ages 65 years and over and women at risk for fractures or osteoporosis.  Skin self-exam. / Monthly.  Influenza vaccine. / Every year.  Tetanus, diphtheria, and acellular pertussis (Tdap/Td) vaccine.** / 1 dose of Td every 10 years.  Varicella vaccine.** / Consult your health care provider.  Zoster vaccine.** / 1 dose for adults aged 60 years or older.  Pneumococcal 13-valent conjugate (PCV13) vaccine.** / Consult your health care provider.  Pneumococcal polysaccharide (PPSV23) vaccine.** / 1 dose for all adults aged 65 years and older.  Meningococcal vaccine.** / Consult your health care provider.  Hepatitis A vaccine.** / Consult your health care provider.  Hepatitis B vaccine.** / Consult your health care provider.  Haemophilus influenzae type b (Hib) vaccine.** / Consult your health care provider. ** Family history and personal history of risk and conditions may change your health care provider's recommendations. Document Released: 07/27/2001 Document Revised: 10/15/2013 Document Reviewed: 10/26/2010 ExitCare Patient Information 2015 [employer]. This information is not intended to replace advice given to you by your health care provider. Make sure you discuss any questions you have with your health care provider.  

## 2014-09-12 LAB — CYTOLOGY - PAP

## 2014-09-25 ENCOUNTER — Other Ambulatory Visit: Payer: Self-pay | Admitting: Obstetrics & Gynecology

## 2014-09-25 DIAGNOSIS — Z1231 Encounter for screening mammogram for malignant neoplasm of breast: Secondary | ICD-10-CM

## 2014-10-25 ENCOUNTER — Ambulatory Visit (HOSPITAL_COMMUNITY)
Admission: RE | Admit: 2014-10-25 | Discharge: 2014-10-25 | Disposition: A | Discharge status: Home / Self Care | Payer: Medicaid Other | Source: Ambulatory Visit | Attending: Obstetrics & Gynecology | Admitting: Obstetrics & Gynecology

## 2014-10-25 DIAGNOSIS — Z1231 Encounter for screening mammogram for malignant neoplasm of breast: Secondary | ICD-10-CM

## 2017-02-04 ENCOUNTER — Encounter: Payer: Self-pay | Admitting: Internal Medicine

## 2017-02-04 ENCOUNTER — Ambulatory Visit (INDEPENDENT_AMBULATORY_CARE_PROVIDER_SITE_OTHER): Payer: Self-pay | Admitting: Internal Medicine

## 2017-02-04 VITALS — BP 112/82 | HR 70 | Resp 12 | Ht 66.0 in | Wt 180.0 lb

## 2017-02-04 DIAGNOSIS — L910 Hypertrophic scar: Secondary | ICD-10-CM

## 2017-02-04 DIAGNOSIS — L03811 Cellulitis of head [any part, except face]: Secondary | ICD-10-CM

## 2017-02-04 DIAGNOSIS — Z23 Encounter for immunization: Secondary | ICD-10-CM

## 2017-02-04 MED ORDER — CEPHALEXIN 250 MG PO CAPS: 250.0000 mg | ORAL_CAPSULE | Freq: Four times a day (QID) | ORAL | 0 refills | Status: DC

## 2017-02-04 NOTE — Progress Notes (Signed)
   Subjective:    Patient ID: Christine Jimenez, female    DOB: 1971-03-18, 46 y.o.   MRN: [id number]  HPI   Here to establish  1.  Keloids on ears.  Started out with keloids on earlobes from pierced ears.  Had these removed by Dr. Towanda Malkin, plastic surgery, twice.  Had lesions start growing again in 2009 and have grown to the size where they now obstruct her external meatus of both ears.  Left side oozes and smells.  She also is having significant problem hearing due to the obstruction of ear canal.  Went to [facility] in 2010 to look into removal, but was told would cost $4,000 and did not have the coverage to have this done.     No outpatient prescriptions have been marked as taking for the 02/04/17 encounter (Office Visit) with Mack Hook, MD.    No Known Allergies   Past Medical History:  Diagnosis Date  . 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 Hx LEEP (loop electrosurgical excision procedure), cervix, pregnancy 2009   Past Surgical History:  Procedure Laterality Date  . EXCISION OF KELOID     twice.  Dr. Towanda Malkin, plastic surgery   Family History  Problem Relation Age of Onset  . Diabetes Mother   . Heart disease Mother   . Hypertension Mother   . Heart disease Father   . Hypertension Father     Social History   Social History  . Marital status: Divorced    Spouse name: N/A  . Number of children: 6  . Years of education: 76   Occupational History  . 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   Social History Main Topics  . Smoking status: Never Smoker  . Smokeless tobacco: Never Used  . Alcohol use No  . Drug use: No  . Sexual activity: No   Other Topics Concern  . Not on file   Social History Narrative   Born in [place].  Has lived in [place] as well.   Grew up in foster homes as mother signed her over to DSS when into legal trouble.   Lives alone, but 2 sons live in town.          Review of Systems       Objective:   Physical Exam  NAD, definite odor from patient Constantly wiping ears. Crusting and ooze trapped in hair about ears. HEENT:  PERRL, EOMI, large multilobed keloids obscuring external canals of both ears.  Unable to visualize left TM as smaller keloid blocks entrance.  Yellow pustular ooze about keloids blocking left ear with mild erythema at bases.  Strong odor. Neck:  Supple, No adenopathy Chest:  CTA CV: RRR with normal S1 and S2, No S3, S4 or murmur.  Radial and DP pulses normal and equal      Assessment & Plan:  1.  Bilateral large keloids of ears, obstructing external auditory canals and with infection of left keloid formation.   Cephalexin 250 mg 4 times daily for 10 days. Good hygiene discussed as possible. Referral to [facility] Dermatology with Dr. Lois Huxley.    2.  HM:  Tdap Patient to fill out Financial Assistance for [facility]

## 2017-02-04 NOTE — Progress Notes (Signed)
LCSW completed new patient screening with pt. Pt endorsed multiple mental health symptoms, including depression, anhedonia, fatigue, and anxiety. She reported that she occasionally has thoughts of death but denied ever having a plan or attempt. She shared that it was more of a feeling of "I'm so tired, I just can't go on." Pt reported that she would like to schedule counseling as soon as she applies for the [employer]. She met social work Theatre manager Angelica Mayotte, who will be her Social worker.

## 2017-02-04 NOTE — Patient Instructions (Signed)
Use white vinegar to clean under your keloids 4 times daily.

## 2017-02-11 ENCOUNTER — Telehealth: Payer: Self-pay | Admitting: Internal Medicine

## 2017-02-11 NOTE — Telephone Encounter (Signed)
Patient called stating she received a phone call from Mike Gip (Volunteer at [facility]) about her Dermatology referral and that she will be contacted by [facility] to do the Financial Asst.   Patient states she has not been contacted by [facility].  Patient was given phone number to call them directly so appointment can be schedule. Pt. To notify us if there is any problems.  Routed to [place] for referral updates

## 2017-02-11 NOTE — Telephone Encounter (Signed)
Referral and OV notes have been faxed to [facility]. Once financial assistance is approved. Appointment may be scheduled

## 2017-03-09 NOTE — Telephone Encounter (Signed)
Social Worker Intern attempted to contact pt, however both contact numbers were disconnected. No services were was offered.

## 2017-04-04 ENCOUNTER — Encounter: Payer: Self-pay | Admitting: Internal Medicine

## 2017-04-05 NOTE — Progress Notes (Signed)
Spoke with patient. She has been approved for financial assistance and has appointment with Dermatology on 04/07/17 @ 8 am.

## 2017-04-05 NOTE — Progress Notes (Signed)
Called number we had listed and number was disconnected. Called patient mother (emergency contact) and updated phone number given for patient. Called new number and no answer and voicemail is full and unable to leave a message.

## 2017-04-05 NOTE — Progress Notes (Signed)
Left message for financial assistance to call and let us know if they have received her application and if they have if it has been approved or denied.

## 2017-04-15 ENCOUNTER — Encounter (HOSPITAL_COMMUNITY): Payer: Self-pay | Admitting: Emergency Medicine

## 2017-04-15 ENCOUNTER — Ambulatory Visit (HOSPITAL_COMMUNITY)
Admission: EM | Admit: 2017-04-15 | Discharge: 2017-04-15 | Disposition: A | Discharge status: Home / Self Care | Payer: Self-pay | Attending: Emergency Medicine | Admitting: Emergency Medicine

## 2017-04-15 DIAGNOSIS — H938X3 Other specified disorders of ear, bilateral: Secondary | ICD-10-CM

## 2017-04-15 DIAGNOSIS — R58 Hemorrhage, not elsewhere classified: Secondary | ICD-10-CM

## 2017-04-15 NOTE — ED Provider Notes (Signed)
[place]    CSN: [id number] Arrival date & time: 04/15/17  1601     History   Chief Complaint Chief Complaint  Patient presents with  . Scar    HPI Christine Jimenez is a 46 y.o. female.   46 year old female has rather large masses exuding from both ears. The started around 2009. She avoided having them evaluated that time and they have since grown to be large enough to insert completely within the helix, fossa and lower portion of the ear. Normal right has not changed or is it painful. It does block her auditory meatus. Her primary complaint is that there was an apparent tear in the left mass. There was some bleeding in the fold of the tear. Patient is here for assistance in stopping the bleeding.       Past Medical History:  Diagnosis Date  . 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 Hx LEEP (loop electrosurgical excision procedure), cervix, pregnancy 2009    Patient Active Problem List   Diagnosis Date Noted  . 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 History of abnormal Pap smear 07/07/2011    Past Surgical History:  Procedure Laterality Date  . EXCISION OF KELOID     twice.  Dr. Towanda Malkin, plastic surgery    OB History    Gravida Para Term Preterm AB Living   6 6 6     6    SAB TAB Ectopic Multiple Live Births                   Home Medications    Prior to Admission medications   Not on File    Family History Family History  Problem Relation Age of Onset  . Diabetes Mother   . Heart disease Mother   . Hypertension Mother   . Heart disease Father   . Hypertension Father     Social History Social History  Substance Use Topics  . Smoking status: Never Smoker  . Smokeless tobacco: Never Used  . Alcohol use No     Allergies   Patient has no known allergies.   Review of Systems Review of Systems  Constitutional: Negative.   HENT: Positive for hearing loss. Negative for congestion, dental problem, postnasal drip and  sinus pressure.        As per history of present illness  Respiratory: Negative.   Gastrointestinal: Negative.   All other systems reviewed and are negative.    Physical Exam Triage Vital Signs ED Triage Vitals [04/15/17 1618]  Enc Vitals Group     BP 113/69     Pulse Rate 81     Resp 18     Temp 98.8 F (37.1 C)     Temp Source Oral     SpO2 95 %     Weight      Height      Head Circumference      Peak Flow      Pain Score      Pain Loc      Pain Edu?      Excl. in [place]?    No data found.   Updated Vital Signs BP 113/69 (BP Location: Left Arm)   Pulse 81   Temp 98.8 F (37.1 C) (Oral)   Resp 18   LMP 04/07/2017   SpO2 95%   Visual Acuity Right Eye Distance:   Left Eye Distance:   Bilateral Distance:    Right Eye Near:  Left Eye Near:    Bilateral Near:     Physical Exam  Constitutional: She is oriented to person, place, and time. She appears well-developed and well-nourished. No distress.  HENT:  Large mass that has separated showing smooth vascular tissue with some bleeding. There are several small areas of capillary bleeding. There is no blood coming from the [facility]  Eyes: EOM are normal.  Neck: Neck supple.  Cardiovascular: Normal rate.   Pulmonary/Chest: Effort normal. No respiratory distress.  Musculoskeletal: She exhibits no edema.  Neurological: She is alert and oriented to person, place, and time. She exhibits normal muscle tone.  Skin: Skin is warm and dry.  Psychiatric: She has a normal mood and affect.  Nursing note and vitals reviewed.    UC Treatments / Results  Labs (all labs ordered are listed, but only abnormal results are displayed) Labs Reviewed - No data to display  EKG  EKG Interpretation None       Radiology No results found.  Procedures Procedures (including critical care time) Procedure note: The soft tissue that was exposed after separation of part of this mass outside of the left ear was cauterized using silver  nitrate sticks. This stopped most of the bleeding. 2 small Surgicel fragments were placed over this portion of the ear. A 4 x 4 was placed in the middle, the   mass was then re-folded back to the original position and the bandage placed. At this time there is no current bleeding. She is to follow-up with her primary care per provider or ENT as soon as possible. edications Ordered in UC Medications - No data to display   Initial Impression / Assessment and Plan / UC Course  I have reviewed the triage vital signs and the nursing notes.  Pertinent labs & imaging results that were available during my care of the patient were reviewed by me and considered in my medical decision making (see chart for details).       Final Clinical Impressions(s) / UC Diagnoses   Final diagnoses:  Ear mass, bilateral  Hemorrhage    New Prescriptions Current Discharge Medication List       Controlled Substance Prescriptions [place] Controlled Substance Registry consulted? Not Applicable   Janne Napoleon, NP 04/15/17 1751

## 2017-04-15 NOTE — Discharge Instructions (Signed)
Keep area dry and clean. Do not scrub. Tomorrow evening may remove the dressing and apply some mild soap and water. Replace the dressing.

## 2017-04-15 NOTE — ED Triage Notes (Signed)
Pt c/o mult keloids onset 1 month +++   Sx include spontaneous bleeding  Denies inj/trauma  A&O x4... NAD... Ambulatory

## 2017-07-26 ENCOUNTER — Ambulatory Visit (INDEPENDENT_AMBULATORY_CARE_PROVIDER_SITE_OTHER): Payer: Medicaid Other | Admitting: Obstetrics & Gynecology

## 2017-07-26 ENCOUNTER — Other Ambulatory Visit (HOSPITAL_COMMUNITY)
Admission: RE | Admit: 2017-07-26 | Discharge: 2017-07-26 | Disposition: A | Discharge status: Home / Self Care | Payer: Medicaid Other | Source: Ambulatory Visit | Attending: Obstetrics & Gynecology | Admitting: Obstetrics & Gynecology

## 2017-07-26 ENCOUNTER — Encounter: Payer: Self-pay | Admitting: Obstetrics & Gynecology

## 2017-07-26 VITALS — BP 126/74 | HR 77 | Ht 64.0 in | Wt 175.4 lb

## 2017-07-26 DIAGNOSIS — Z01419 Encounter for gynecological examination (general) (routine) without abnormal findings: Secondary | ICD-10-CM | POA: Insufficient documentation

## 2017-07-26 DIAGNOSIS — Z309 Encounter for contraceptive management, unspecified: Secondary | ICD-10-CM

## 2017-07-26 DIAGNOSIS — Z3042 Encounter for surveillance of injectable contraceptive: Secondary | ICD-10-CM

## 2017-07-26 DIAGNOSIS — Z3049 Encounter for surveillance of other contraceptives: Secondary | ICD-10-CM

## 2017-07-26 DIAGNOSIS — N939 Abnormal uterine and vaginal bleeding, unspecified: Secondary | ICD-10-CM | POA: Insufficient documentation

## 2017-07-26 LAB — POCT PREGNANCY, URINE: PREG TEST UR: NEGATIVE

## 2017-07-26 MED ORDER — MEDROXYPROGESTERONE ACETATE 150 MG/ML IM SUSP
150.0000 mg | INTRAMUSCULAR | Status: DC
  Administered 2017-07-26 – 2017-10-27 (×2): 150 mg via INTRAMUSCULAR

## 2017-07-26 NOTE — Progress Notes (Addendum)
GYNECOLOGY ANNUAL PREVENTATIVE CARE ENCOUNTER NOTE  Subjective:   Christine Jimenez is a 47 y.o. [phone] female here for a routine annual gynecologic exam.  Current complaints: AUB since 1 year ago which she attributes to stress.  Declines evaluation for this now due to insurance issues, no current concerning symptoms.  Wants to restart Depo Provera for contraception, also says it helped her with AUB in the past. Denies abnormal vaginal, discharge, pelvic pain, problems with intercourse or other gynecologic concerns.    Gynecologic History Patient's last menstrual period was 07/21/2017. Contraception: none now, desires Depo Provera. Last Pap: 2016. Results were: normal Last mammogram: 2016. Results were: normal  Obstetric History OB History  Gravida Para Term Preterm AB Living  6 6 6     6   SAB TAB Ectopic Multiple Live Births               # Outcome Date GA Lbr Len/2nd Weight Sex Delivery Anes PTL Lv  6 Term           5 Term           4 Term           3 Term           2 Term           1 Term               Past Medical History:  Diagnosis Date  . 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 Hx LEEP (loop electrosurgical excision procedure), cervix, pregnancy 2009    Past Surgical History:  Procedure Laterality Date  . EXCISION OF KELOID     twice.  Dr. Towanda Malkin, plastic surgery    No current outpatient medications on file prior to visit.   No current facility-administered medications on file prior to visit.     No Known Allergies  Social History   Socioeconomic History  . Marital status: Divorced    Spouse name: Not on file  . Number of children: 6  . Years of education: 65  . Highest education level: Not on file  Social Needs  . Financial resource strain: Not on file  . Food insecurity - worry: Not on file  . Food insecurity - inability: Not on file  . Transportation needs - medical: Not on file  . Transportation needs - non-medical:  Not on file  Occupational History  . 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  Tobacco Use  . Smoking status: Never Smoker  . Smokeless tobacco: Never Used  Substance and Sexual Activity  . Alcohol use: No  . Drug use: No  . Sexual activity: No    Birth control/protection: None  Other Topics Concern  . Not on file  Social History Narrative   Born in [place].  Has lived in [place] as well.   Grew up in foster homes as mother signed her over to DSS when into legal trouble.   Lives alone, but 2 sons live in town.      Family History  Problem Relation Age of Onset  . Diabetes Mother   . Heart disease Mother   . Hypertension Mother   . Heart disease Father   . Hypertension Father     The following portions of the patient's history were reviewed and updated as appropriate: allergies, current medications, past family history, past medical history, past social history, past surgical history and problem  list.  Review of Systems Pertinent items noted in HPI and remainder of comprehensive ROS otherwise negative.   Objective:  BP 126/74   Pulse 77   Ht 5\' 4"  (1.626 m)   Wt 175 lb 6.4 oz (79.6 kg)   LMP 07/21/2017   BMI 30.11 kg/m  CONSTITUTIONAL: Well-developed, well-nourished female in no acute distress.  HENT:  Normocephalic, atraumatic, External right and left ear normal. Oropharynx is clear and moist EYES: Conjunctivae and EOM are normal. Pupils are equal, round, and reactive to light. No scleral icterus.  NECK: Normal range of motion, supple, no masses.  Normal thyroid.  SKIN: Skin is warm and dry. No rash noted. Not diaphoretic. No erythema. No pallor. NEUROLOGIC: Alert and oriented to person, place, and time. Normal reflexes, muscle tone coordination. No cranial nerve deficit noted. PSYCHIATRIC: Normal mood and affect. Normal behavior. Normal judgment and thought content. CARDIOVASCULAR: Normal heart rate noted, regular rhythm RESPIRATORY: Clear to  auscultation bilaterally. Effort and breath sounds normal, no problems with respiration noted. BREASTS: Symmetric in size. No masses, skin changes, nipple drainage, or lymphadenopathy. ABDOMEN: Soft, normal bowel sounds, no distention noted.  No tenderness, rebound or guarding.  PELVIC: Normal appearing external genitalia; normal appearing vaginal mucosa and cervix.  Bloody discharge noted, slow trickle of blood noted from cervix.  Pap smear obtained.  Enlarged uterine size, no other palpable masses, no uterine or adnexal tenderness. MUSCULOSKELETAL: Normal range of motion. No tenderness.  No cyanosis, clubbing, or edema.  2+ distal pulses.   Clinic UPT: Negative  Assessment and Plan:  1. Encounter for Depo-Provera contraception - medroxyPROGESTERone (DEPO-PROVERA) injection 150 mg given today for contraception, will hopefully help with AUB too.  Will return in 3 months for repeat injection.   2. Encounter for gynecological examination with Papanicolaou smear of cervix - Cytology - PAP Will follow up results of pap smear and manage accordingly. Mammogram to be scheduled; scholarship application given Will return for evaluation of AUB at a later date; likely has fibroids given enlarged uterus on exam.  Bleeding precautions reviewed.  Routine preventative health maintenance measures emphasized. Please refer to After Visit Summary for other counseling recommendations.    Return in about 3 months (around 10/23/2017), or if symptoms worsen or fail to improve, for Depo Provera injection.   Verita Schneiders, MD, [place] for [facility], [place]

## 2017-07-26 NOTE — Patient Instructions (Addendum)
Dysfunctional Uterine Bleeding Dysfunctional uterine bleeding is abnormal bleeding from the uterus. Dysfunctional uterine bleeding includes:  A period that comes earlier or later than usual.  A period that is lighter, heavier, or has blood clots.  Bleeding between periods.  Skipping one or more periods.  Bleeding after sexual intercourse.  Bleeding after menopause.  Follow these instructions at home: Pay attention to any changes in your symptoms. Follow these instructions to help with your condition: Eating and drinking  Eat well-balanced meals. Include foods that are high in iron, such as liver, meat, shellfish, green leafy vegetables, and eggs.  If you become constipated: ? Drink plenty of water. ? Eat fruits and vegetables that are high in water and fiber, such as spinach, carrots, raspberries, apples, and mango. Medicines  Take over-the-counter and prescription medicines only as told by your health care provider.  Do not change medicines without talking with your health care provider.  Aspirin or medicines that contain aspirin may make the bleeding worse. Do not take those medicines: ? During the week before your period. ? During your period.  If you were prescribed iron pills, take them as told by your health care provider. Iron pills help to replace iron that your body loses because of this condition. Activity  If you need to change your sanitary pad or tampon more than one time every 2 hours: ? Lie in bed with your feet raised (elevated). ? Place a cold pack on your lower abdomen. ? Rest as much as possible until the bleeding stops or slows down.  Do not try to lose weight until the bleeding has stopped and your blood iron level is back to normal. Other Instructions  For two months, write down: ? When your period starts. ? When your period ends. ? When any abnormal bleeding occurs. ? What problems you notice.  Keep all follow up visits as told by your health  care provider. This is important. Contact a health care provider if:  You get light-headed or weak.  You have nausea and vomiting.  You cannot eat or drink without vomiting.  You feel dizzy or have diarrhea while you are taking medicines.  You are taking birth control pills or hormones, and you want to change them or stop taking them. Get help right away if:  You develop a fever or chills.  You need to change your sanitary pad or tampon more than one time per hour.  Your bleeding becomes heavier, or your flow contains clots more often.  You develop pain in your abdomen.  You lose consciousness.  You develop a rash. This information is not intended to replace advice given to you by your health care provider. Make sure you discuss any questions you have with your health care provider. Document Released: 05/28/2000 Document Revised: 11/06/2015 Document Reviewed: 08/26/2014 Elsevier Interactive Patient Education  2018 [place] Years, Female Preventive care refers to lifestyle choices and visits with your health care provider that can promote health and wellness. What does preventive care include?  A yearly physical exam. This is also called an annual well check.  Dental exams once or twice a year.  Routine eye exams. Ask your health care provider how often you should have your eyes checked.  Personal lifestyle choices, including: ? Daily care of your teeth and gums. ? Regular physical activity. ? Eating a healthy diet. ? Avoiding tobacco and drug use. ? Limiting alcohol use. ? Practicing safe sex. ?  Taking low-dose aspirin daily starting at age 28. ? Taking vitamin and mineral supplements as recommended by your health care provider. What happens during an annual well check? The services and screenings done by your health care provider during your annual well check will depend on your age, overall health, lifestyle risk factors, and family  history of disease. Counseling Your health care provider may ask you questions about your:  Alcohol use.  Tobacco use.  Drug use.  Emotional well-being.  Home and relationship well-being.  Sexual activity.  Eating habits.  Work and work Statistician.  Method of birth control.  Menstrual cycle.  Pregnancy history.  Screening You may have the following tests or measurements:  Height, weight, and BMI.  Blood pressure.  Lipid and cholesterol levels. These may be checked every 5 years, or more frequently if you are over 10 years old.  Skin check.  Lung cancer screening. You may have this screening every year starting at age 22 if you have a 30-pack-year history of smoking and currently smoke or have quit within the past 15 years.  Fecal occult blood test (FOBT) of the stool. You may have this test every year starting at age 66.  Flexible sigmoidoscopy or colonoscopy. You may have a sigmoidoscopy every 5 years or a colonoscopy every 10 years starting at age 55.  Hepatitis C blood test.  Hepatitis B blood test.  Sexually transmitted disease (STD) testing.  Diabetes screening. This is done by checking your blood sugar (glucose) after you have not eaten for a while (fasting). You may have this done every 1-3 years.  Mammogram. This may be done every 1-2 years. Talk to your health care provider about when you should start having regular mammograms. This may depend on whether you have a family history of breast cancer.  BRCA-related cancer screening. This may be done if you have a family history of breast, ovarian, tubal, or peritoneal cancers.  Pelvic exam and Pap test. This may be done every 3 years starting at age 24. Starting at age 11, this may be done every 5 years if you have a Pap test in combination with an HPV test.  Bone density scan. This is done to screen for osteoporosis. You may have this scan if you are at high risk for osteoporosis.  Discuss your test  results, treatment options, and if necessary, the need for more tests with your health care provider. Vaccines Your health care provider may recommend certain vaccines, such as:  Influenza vaccine. This is recommended every year.  Tetanus, diphtheria, and acellular pertussis (Tdap, Td) vaccine. You may need a Td booster every 10 years.  Varicella vaccine. You may need this if you have not been vaccinated.  Zoster vaccine. You may need this after age 88.  Measles, mumps, and rubella (MMR) vaccine. You may need at least one dose of MMR if you were born in 1957 or later. You may also need a second dose.  Pneumococcal 13-valent conjugate (PCV13) vaccine. You may need this if you have certain conditions and were not previously vaccinated.  Pneumococcal polysaccharide (PPSV23) vaccine. You may need one or two doses if you smoke cigarettes or if you have certain conditions.  Meningococcal vaccine. You may need this if you have certain conditions.  Hepatitis A vaccine. You may need this if you have certain conditions or if you travel or work in places where you may be exposed to hepatitis A.  Hepatitis B vaccine. You may need this if you  have certain conditions or if you travel or work in places where you may be exposed to hepatitis B.  Haemophilus influenzae type b (Hib) vaccine. You may need this if you have certain conditions.  Talk to your health care provider about which screenings and vaccines you need and how often you need them. This information is not intended to replace advice given to you by your health care provider. Make sure you discuss any questions you have with your health care provider. Document Released: 06/27/2015 Document Revised: 02/18/2016 Document Reviewed: 04/01/2015 Elsevier Interactive Patient Education  [employer].

## 2017-07-26 NOTE — Addendum Note (Signed)
Addended by: Verita Schneiders A on: 07/26/2017 11:00 AM   Modules accepted: Orders

## 2017-07-27 ENCOUNTER — Other Ambulatory Visit: Payer: Self-pay | Admitting: Obstetrics & Gynecology

## 2017-07-27 DIAGNOSIS — Z1231 Encounter for screening mammogram for malignant neoplasm of breast: Secondary | ICD-10-CM

## 2017-07-28 LAB — CYTOLOGY - PAP
Bacterial vaginitis: NEGATIVE
CHLAMYDIA, DNA PROBE: NEGATIVE
Candida vaginitis: NEGATIVE
DIAGNOSIS: NEGATIVE
HPV: NOT DETECTED
Neisseria Gonorrhea: NEGATIVE
Trichomonas: NEGATIVE

## 2017-10-11 ENCOUNTER — Ambulatory Visit: Payer: Medicaid Other

## 2017-10-27 ENCOUNTER — Encounter: Payer: Self-pay | Admitting: Obstetrics & Gynecology

## 2017-10-27 ENCOUNTER — Ambulatory Visit (INDEPENDENT_AMBULATORY_CARE_PROVIDER_SITE_OTHER): Payer: Medicaid Other | Admitting: Obstetrics & Gynecology

## 2017-10-27 ENCOUNTER — Encounter

## 2017-10-27 VITALS — BP 114/69 | HR 85 | Wt 174.4 lb

## 2017-10-27 DIAGNOSIS — Z3042 Encounter for surveillance of injectable contraceptive: Secondary | ICD-10-CM | POA: Diagnosis not present

## 2017-10-27 DIAGNOSIS — Z3202 Encounter for pregnancy test, result negative: Secondary | ICD-10-CM

## 2017-10-27 DIAGNOSIS — N939 Abnormal uterine and vaginal bleeding, unspecified: Secondary | ICD-10-CM | POA: Diagnosis not present

## 2017-10-27 DIAGNOSIS — N852 Hypertrophy of uterus: Secondary | ICD-10-CM

## 2017-10-27 DIAGNOSIS — D219 Benign neoplasm of connective and other soft tissue, unspecified: Secondary | ICD-10-CM | POA: Diagnosis not present

## 2017-10-27 LAB — POCT PREGNANCY, URINE: PREG TEST UR: NEGATIVE

## 2017-10-28 ENCOUNTER — Encounter: Payer: Self-pay | Admitting: Obstetrics & Gynecology

## 2017-10-28 DIAGNOSIS — D500 Iron deficiency anemia secondary to blood loss (chronic): Secondary | ICD-10-CM | POA: Insufficient documentation

## 2017-10-28 DIAGNOSIS — D649 Anemia, unspecified: Secondary | ICD-10-CM | POA: Insufficient documentation

## 2017-10-28 LAB — CBC
Hematocrit: 24 % — ABNORMAL LOW (ref 34.0–46.6)
Hemoglobin: 6.8 g/dL — CL (ref 11.1–15.9)
MCH: 17 pg — AB (ref 26.6–33.0)
MCHC: 28.3 g/dL — ABNORMAL LOW (ref 31.5–35.7)
MCV: 60 fL — ABNORMAL LOW (ref 79–97)
PLATELETS: 355 10*3/uL (ref 150–379)
RBC: 3.99 x10E6/uL (ref 3.77–5.28)
RDW: 21.4 % — ABNORMAL HIGH (ref 12.3–15.4)
WBC: 4.1 10*3/uL (ref 3.4–10.8)

## 2017-10-28 LAB — TSH: TSH: 1.13 u[IU]/mL (ref 0.450–4.500)

## 2017-10-28 NOTE — Progress Notes (Signed)
   GYNECOLOGY OFFICE VISIT NOTE  History:  47 y.o. [id number] here today for evaluation of menorrhagia.  Menorrhagia is being effectively managed on Depo Provera but she had an enlarged uterus during last visit, and wants evaluation by imaging. Also reports eating ice a lot, wants to make sure she is not anemic. She denies any abnormal vaginal discharge, bleeding, pelvic pain or other concerns.   Past Medical History:  Diagnosis Date  . 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 Hx LEEP (loop electrosurgical excision procedure), cervix, pregnancy 2009    Past Surgical History:  Procedure Laterality Date  . EXCISION OF KELOID     twice.  Dr. Towanda Malkin, plastic surgery    The following portions of the patient's history were reviewed and updated as appropriate: allergies, current medications, past family history, past medical history, past social history, past surgical history and problem list.   Health Maintenance:  Normal pap and negative HRHPV on 07/26/2017.  Normal mammogram on 10/25/2014; repeat already ordered.   Review of Systems:  Pertinent items noted in HPI and remainder of comprehensive ROS otherwise negative.  Objective:  Physical Exam BP 114/69   Pulse 85   Wt 174 lb 6.4 oz (79.1 kg)   LMP 10/23/2017 (Exact Date)   BMI 29.94 kg/m  CONSTITUTIONAL: Well-developed, well-nourished female in no acute distress.  NEUROLOGIC: Alert and oriented to person, place, and time. Normal reflexes, muscle tone coordination. No cranial nerve deficit noted. PSYCHIATRIC: Normal mood and affect. Normal behavior. Normal judgment and thought content. CARDIOVASCULAR: Normal heart rate noted RESPIRATORY: Effort and breath sounds normal, no problems with respiration noted ABDOMEN: Soft, no distention noted.   PELVIC: Deferred MUSCULOSKELETAL: Normal range of motion. No edema noted.  Labs and Imaging Results for orders placed or performed in visit on 10/27/17 (from the past 48  hour(s))  Pregnancy, urine POC     Status: None   Collection Time: 10/27/17  4:46 PM  Result Value Ref Range   Preg Test, Ur NEGATIVE NEGATIVE    Comment:        THE SENSITIVITY OF THIS METHODOLOGY IS >24 mIU/mL     Assessment & Plan:  1. Enlarged uterus Will obtain ultrasound; will follow up results and manage accordingly. - US Pelvis Complete; Future - US Transvaginal Non-OB; Future  2. Abnormal uterine bleeding (AUB) Labs ordered, will follow up results and manage accordingly. - CBC - TSH Bleeding precautions reviewed.  Routine preventative health maintenance measures emphasized. Please refer to After Visit Summary for other counseling recommendations.   Return if symptoms worsen or fail to improve.   Total face-to-face time with patient: 15 minutes.  Over 50% of encounter was spent on counseling and coordination of care.   Verita Schneiders, MD, [place] for [facility], [place]

## 2017-10-28 NOTE — Patient Instructions (Signed)
Return to clinic for any scheduled appointments or for any gynecologic concerns as needed.   

## 2017-10-31 ENCOUNTER — Telehealth: Payer: Self-pay | Admitting: General Practice

## 2017-10-31 ENCOUNTER — Telehealth: Payer: Self-pay | Admitting: *Deleted

## 2017-10-31 NOTE — Telephone Encounter (Signed)
I called Christine Jimenez re: mammogram and she notified me someone else was calling her . I notified her that she was very anemic and Dr. Harolyn Rutherford recommended a blood transfusion if she was agreeable. Christine Jimenez states yes she is agreeable. I informed her I would call her back with more information.

## 2017-10-31 NOTE — Telephone Encounter (Addendum)
Per Dr Harolyn Rutherford, Patient is very anemic, please call to offer transfusion. If she does not want this, she will need IV iron infusion at the least.  Called patient, no answer- left message on voicemail stating we are trying to reach you with urgent results, please call us back today & ask to speak with a nurse when you call.

## 2017-10-31 NOTE — Telephone Encounter (Signed)
Received message from Dr. Harolyn Rutherford to schedule mammogram. [facility] and they state her medicaid will not cover but she had been given the mammogram scholoarship form and been approved. She needs to call  [phone] to schedule. I called Ghazal and notified her and gave her the number. She voices understanding.

## 2017-10-31 NOTE — Telephone Encounter (Signed)
I called Christine Jimenez and notified her she can come in tonight to get blood. She states she can't come tonight but is agreeable to come tomorrow after she gets off work at [employer]. I instructed her to report to [facility]. I called 3rd floor to notify and to find out how long patient will be here.  I informed Christine Jimenez it is likely she will be here 8-12 hours or longer. She is worried about missing work ,and wanted to wait until Friday afternoon. I encouraged her to go ahead and come in tomorrow if possible since her hgb is so low. I informed her they can give her a note for work if needed.  She states she will come in tomorrow.

## 2017-11-01 ENCOUNTER — Other Ambulatory Visit: Payer: Self-pay

## 2017-11-01 ENCOUNTER — Encounter (HOSPITAL_COMMUNITY): Payer: Self-pay | Admitting: *Deleted

## 2017-11-01 ENCOUNTER — Inpatient Hospital Stay (HOSPITAL_COMMUNITY)
Admission: AD | Admit: 2017-11-01 | Discharge: 2017-11-02 | DRG: 812 | Disposition: A | Discharge status: Home / Self Care | Payer: Medicaid Other | Source: Ambulatory Visit | Attending: Obstetrics and Gynecology | Admitting: Obstetrics and Gynecology

## 2017-11-01 DIAGNOSIS — D500 Iron deficiency anemia secondary to blood loss (chronic): Principal | ICD-10-CM | POA: Diagnosis present

## 2017-11-01 DIAGNOSIS — D649 Anemia, unspecified: Secondary | ICD-10-CM

## 2017-11-01 DIAGNOSIS — N938 Other specified abnormal uterine and vaginal bleeding: Secondary | ICD-10-CM | POA: Diagnosis present

## 2017-11-01 LAB — PREPARE RBC (CROSSMATCH)

## 2017-11-01 LAB — ABO/RH: ABO/RH(D): B POS

## 2017-11-01 MED ORDER — ACETAMINOPHEN 325 MG PO TABS
650.0000 mg | ORAL_TABLET | Freq: Once | ORAL | Status: AC
  Administered 2017-11-01: 650 mg via ORAL
  Filled 2017-11-01: qty 2

## 2017-11-01 MED ORDER — DIPHENHYDRAMINE HCL 25 MG PO CAPS
25.0000 mg | ORAL_CAPSULE | Freq: Once | ORAL | Status: AC
  Administered 2017-11-01: 25 mg via ORAL
  Filled 2017-11-01: qty 1

## 2017-11-01 MED ORDER — PRENATAL MULTIVITAMIN CH
1.0000 | ORAL_TABLET | Freq: Every day | ORAL | Status: DC
  Administered 2017-11-01: 1 via ORAL
  Filled 2017-11-01: qty 1

## 2017-11-01 MED ORDER — SODIUM CHLORIDE 0.9 % IV SOLN: Freq: Once | INTRAVENOUS | Status: DC

## 2017-11-01 MED ORDER — IBUPROFEN 600 MG PO TABS
600.0000 mg | ORAL_TABLET | Freq: Four times a day (QID) | ORAL | Status: DC | PRN
Start: 2017-11-01 — End: 2017-11-02
  Administered 2017-11-01: 600 mg via ORAL
  Filled 2017-11-01: qty 1

## 2017-11-01 NOTE — H&P (Signed)
Christine Jimenez is an 47 y.o. female G6P6 here for management of anemia. Patient with history of DUB for the past year currently managed with depo-provera. She describes a monthly period lasting 8 days. Prior to depo-provera she bled continuously. She denies any pelvic pain. She admits to consuming a lot of ice and feeling fatigue. She was found to be anemic during a recent office visit and is here to receive a blood transfusion. Patient is scheduled to have a pelvic ultrasound tomorrow. Patient is without any other complaints  Pertinent Gynecological History: Menses: regular every month without intermenstrual spotting Bleeding: dysfunctional uterine bleeding Contraception: Depo-Provera injections DES exposure: denies Last mammogram: normal Date: 2016 Last pap: normal Date: 07/2017 OB History: G6, P6   Menstrual History: Patient's last menstrual period was 10/23/2017 (exact date).    Past Medical History:  Diagnosis Date  . 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 Hx LEEP (loop electrosurgical excision procedure), cervix, pregnancy 2009    Past Surgical History:  Procedure Laterality Date  . EXCISION OF KELOID     twice.  Dr. Towanda Malkin, plastic surgery    Family History  Problem Relation Age of Onset  . Diabetes Mother   . Heart disease Mother   . Hypertension Mother   . Heart disease Father   . Hypertension Father     Social History:  reports that she has never smoked. She has never used smokeless tobacco. She reports that she does not drink alcohol or use drugs.  Allergies: No Known Allergies  Facility-Administered Medications Prior to Admission  Medication Dose Route Frequency Provider Last Rate Last Dose  . medroxyPROGESTERone (DEPO-PROVERA) injection 150 mg  150 mg Intramuscular Q90 days Anyanwu, Ugonna A, MD   150 mg at 10/27/17 1647   No medications prior to admission.    ROS See pertinent in HPI Blood pressure 127/78, pulse 77, temperature  98.3 F (36.8 C), temperature source Oral, resp. rate 18, last menstrual period 10/23/2017, SpO2 100 %. Physical Exam GENERAL: Well-developed, well-nourished female in no acute distress.  LUNGS: Clear to auscultation bilaterally.  HEART: Regular rate and rhythm. ABDOMEN: Soft, nontender, nondistended.  PELVIC: Deferred EXTREMITIES: No cyanosis, clubbing, or edema, 2+ distal pulses.  No results found for this or any previous visit (from the past 24 hour(s)).  No results found.  Assessment/Plan: 47 yo G6P6 with anemia associated with DUB currently on depo-provera - Admit for blood transfusion - patient to receive 3 units PRBC - Pelvic ultrasound scheduled for tomorrow   Christine Jimenez 11/01/2017, 1:57 PM

## 2017-11-02 ENCOUNTER — Ambulatory Visit (HOSPITAL_COMMUNITY)
Admission: RE | Admit: 2017-11-02 | Discharge: 2017-11-02 | Disposition: A | Discharge status: Home / Self Care | Payer: Self-pay | Source: Ambulatory Visit | Attending: Obstetrics & Gynecology | Admitting: Obstetrics & Gynecology

## 2017-11-02 DIAGNOSIS — N938 Other specified abnormal uterine and vaginal bleeding: Secondary | ICD-10-CM | POA: Insufficient documentation

## 2017-11-02 DIAGNOSIS — D251 Intramural leiomyoma of uterus: Secondary | ICD-10-CM | POA: Insufficient documentation

## 2017-11-02 DIAGNOSIS — N852 Hypertrophy of uterus: Secondary | ICD-10-CM

## 2017-11-02 DIAGNOSIS — D252 Subserosal leiomyoma of uterus: Secondary | ICD-10-CM | POA: Insufficient documentation

## 2017-11-02 LAB — BPAM RBC
BLOOD PRODUCT EXPIRATION DATE: 201906122359
Blood Product Expiration Date: 201906122359
Blood Product Expiration Date: 201906122359
ISSUE DATE / TIME: 201905211921
ISSUE DATE / TIME: 201905212239
ISSUE DATE / TIME: 201905211547
UNIT TYPE AND RH: 5100
Unit Type and Rh: 5100
Unit Type and Rh: 5100

## 2017-11-02 LAB — TYPE AND SCREEN
ABO/RH(D): B POS
Antibody Screen: NEGATIVE
UNIT DIVISION: 0
UNIT DIVISION: 0
Unit division: 0

## 2017-11-02 LAB — CBC
HEMATOCRIT: 30.1 % — AB (ref 36.0–46.0)
HEMOGLOBIN: 10.2 g/dL — AB (ref 12.0–15.0)
MCH: 21.2 pg — ABNORMAL LOW (ref 26.0–34.0)
MCHC: 33.9 g/dL (ref 30.0–36.0)
MCV: 62.4 fL — AB (ref 78.0–100.0)
PLATELETS: 294 10*3/uL (ref 150–400)
RBC: 4.82 MIL/uL (ref 3.87–5.11)
RDW: 26.6 % — AB (ref 11.5–15.5)
WBC: 6.9 10*3/uL (ref 4.0–10.5)

## 2017-11-02 MED ORDER — FERROUS SULFATE 325 (65 FE) MG PO TABS: 325.0000 mg | ORAL_TABLET | Freq: Two times a day (BID) | ORAL | 1 refills | Status: DC

## 2017-11-02 NOTE — Discharge Summary (Signed)
    OB Discharge Summary     Patient Name: Christine Jimenez DOB: [birth year] MRN: [id number]  Date of admission: 11/01/2017  Date of discharge: 11/02/2017  Admitting diagnosis: blood transfusion  Secondary diagnosis:  Active Problems:   Symptomatic anemia     Discharge diagnosis: anemia associated with DUB resolved                                                                                                Hospital course:  Patient admitted for the management of symptomatic anemia associated with DUB. Patient with long history of DUB currently medically managed with depo-provera. Patient was found to have a hg 6.8 during a recent office visit with complaints of fatigue and PICA. Patient was admitted and received 3 units pRBC with post transfusion Hg 10. Patient was found stable for discharge. She is scheduled for a pelvic ultrasound today and will follow up with [facility] for further results and further management  Physical exam  Vitals:   11/01/17 2305 11/02/17 0122 11/02/17 0328 11/02/17 0809  BP: 115/71 114/76 115/66 118/79  Pulse: 77 66 66 63  Resp: 18 20 16 18   Temp: 98.9 F (37.2 C) 98.4 F (36.9 C) 98.7 F (37.1 C) 98.2 F (36.8 C)  TempSrc: Oral Oral Oral Oral  SpO2: 100% 100% 100% 100%  Weight:      Height:       GENERAL: Well-developed, well-nourished female in no acute distress.  LUNGS: Clear to auscultation bilaterally.  HEART: Regular rate and rhythm. ABDOMEN: Soft, nontender, nondistended. No organomegaly. PELVIC: Not performed EXTREMITIES: No cyanosis, clubbing, or edema, 2+ distal pulses.  Labs: Lab Results  Component Value Date   WBC 6.9 11/02/2017   HGB 10.2 (L) 11/02/2017   HCT 30.1 (L) 11/02/2017   MCV 62.4 (L) 11/02/2017   PLT 294 11/02/2017   No flowsheet data found.  Discharge instruction: per After Visit Summary   After visit meds:  Allergies as of 11/02/2017   No Known Allergies     Medication List    TAKE these medications   ferrous  sulfate 325 (65 FE) MG tablet Commonly known as:  FERROUSUL Take 1 tablet (325 mg total) by mouth 2 (two) times daily.   ibuprofen 200 MG tablet Commonly known as:  ADVIL,MOTRIN Take 600 mg by mouth every 6 (six) hours as needed.       Diet: routine diet  Activity: Advance as tolerated.   Outpatient follow up: an appointment will be made for her to discuss ultrasound and further management Follow up Appt: Future Appointments  Date Time Provider [place]  11/02/2017  2:00 PM [facility] 1 [facility]US 203  01/17/2018  1:30 PM [place] [facility]   Follow up Visit:No follow-ups on file.   11/02/2017 Mora Bellman, MD

## 2017-11-02 NOTE — Discharge Instructions (Signed)

## 2017-11-04 ENCOUNTER — Telehealth: Payer: Self-pay

## 2017-11-04 NOTE — Telephone Encounter (Addendum)
-----   Message from Osborne Oman, MD sent at 11/02/2017  1:41 PM EDT ----- Multiple fibroids noted on ultrasound, less than 3.6 cm in size.  No intervention is needed at this point unless bleeding worsens or if bleeding is not controlled medically. Please call to inform patient of results and recommendations.  LM for pt to return call the office.

## 2017-11-08 NOTE — Telephone Encounter (Signed)
Informed patient of ultrasound results and recommendations.

## 2018-01-17 ENCOUNTER — Ambulatory Visit: Payer: Medicaid Other

## 2018-01-19 ENCOUNTER — Ambulatory Visit: Payer: Medicaid Other

## 2018-03-08 ENCOUNTER — Encounter: Payer: Self-pay | Admitting: Obstetrics & Gynecology

## 2018-03-08 ENCOUNTER — Ambulatory Visit (INDEPENDENT_AMBULATORY_CARE_PROVIDER_SITE_OTHER): Payer: Medicaid Other | Admitting: Obstetrics & Gynecology

## 2018-03-08 VITALS — BP 116/75 | HR 85 | Ht 66.0 in | Wt 191.0 lb

## 2018-03-08 DIAGNOSIS — Z0000 Encounter for general adult medical examination without abnormal findings: Secondary | ICD-10-CM

## 2018-03-08 DIAGNOSIS — Z01419 Encounter for gynecological examination (general) (routine) without abnormal findings: Secondary | ICD-10-CM

## 2018-03-08 DIAGNOSIS — N926 Irregular menstruation, unspecified: Secondary | ICD-10-CM

## 2018-03-08 NOTE — Progress Notes (Signed)
Subjective:    Christine Jimenez is a 47 y.o. single P6  female who presents for an annual exam. The patient has no complaints today. She does not want to continue depo provera.  The patient is sexually active. GYN screening history: last pap: was normal. The patient wears seatbelts: yes. The patient participates in regular exercise: yes. Has the patient ever been transfused or tattooed?: yes. The patient reports that there is not domestic violence in her life.   She was admitted for anemia and transfused 3 units of blood 5/19.   Menstrual History: OB History    Gravida  6   Para  6   Term  6   Preterm      AB      Living  6     SAB      TAB      Ectopic      Multiple      Live Births              Menarche age: 62 No LMP recorded.    The following portions of the patient's history were reviewed and updated as appropriate: allergies, current medications, past family history, past medical history, past social history, past surgical history and problem list.  Review of Systems Pertinent items are noted in HPI.   Works at a retirement home, med tech FH- no breast/gyn/colon cancer, + DM Just started having periods again (was on depo provera) Pap smear 2/19 normal She has been with her current partner for about 1 1/2 years, denies dyspareunia   Objective:    BP 116/75   Pulse 85   Ht 5\' 6"  (1.676 m)   Wt 191 lb (86.6 kg)   BMI 30.83 kg/m   General Appearance:    Alert, cooperative, no distress, appears stated age  Head:    Normocephalic, without obvious abnormality, atraumatic  Eyes:    PERRL, conjunctiva/corneas clear, EOM's intact, fundi    benign, both eyes  Ears:    Normal TM's and external ear canals, both ears  Nose:   Nares normal, septum midline, mucosa normal, no drainage    or sinus tenderness  Throat:   Lips, mucosa, and tongue normal; teeth and gums normal  Neck:   Supple, symmetrical, trachea midline, no adenopathy;    thyroid:  no  enlargement/tenderness/nodules; no carotid   bruit or JVD  Back:     Symmetric, no curvature, ROM normal, no CVA tenderness  Lungs:     Clear to auscultation bilaterally, respirations unlabored  Chest Wall:    No tenderness or deformity   Heart:    Regular rate and rhythm, S1 and S2 normal, no murmur, rub   or gallop  Breast Exam:    No tenderness, masses, or nipple abnormality  Abdomen:     Soft, non-tender, bowel sounds active all four quadrants,    no masses, no organomegaly  Genitalia:    Normal female without lesion, discharge or tenderness, upper limit of normal size and shape, anteverted, mobile, non-tender, normal adnexal exam      Extremities:   Extremities normal, atraumatic, no cyanosis or edema  Pulses:   2+ and symmetric all extremities  Skin:   Skin color, texture, turgor normal, no rashes or lesions  Lymph nodes:   Cervical, supraclavicular, and axillary nodes normal  Neurologic:   CNII-XII intact, normal strength, sensation and reflexes    throughout  .    Assessment:    Healthy female exam.  Plan:   Check FSH. She plans to use condoms if she is still fertile hba1c STI testing If the abnormal bleeding returns, then she needs an [facility]

## 2018-03-09 ENCOUNTER — Telehealth: Payer: Self-pay | Admitting: General Practice

## 2018-03-09 LAB — HEPATITIS C ANTIBODY

## 2018-03-09 LAB — FOLLICLE STIMULATING HORMONE: FSH: 4.2 m[IU]/mL

## 2018-03-09 LAB — HEPATITIS B SURFACE ANTIGEN: Hepatitis B Surface Ag: NEGATIVE

## 2018-03-09 LAB — RPR: RPR: NONREACTIVE

## 2018-03-09 LAB — HIV ANTIBODY (ROUTINE TESTING W REFLEX): HIV Screen 4th Generation wRfx: NONREACTIVE

## 2018-03-09 NOTE — Telephone Encounter (Signed)
Patient called & left message stating she is returning our phone call.  Called patient & informed her of results & recommendation. Patient verbalized understanding & had no questions.

## 2018-03-09 NOTE — Telephone Encounter (Signed)
Called patient, no answer- left message to call us back for results.  

## 2018-03-09 NOTE — Telephone Encounter (Signed)
-----   Message from Emily Filbert, MD sent at 03/09/2018  8:05 AM EDT ----- Please let her know that her lab test shows that she can still get pregnant. She should use condoms prn

## 2018-10-25 IMAGING — US US TRANSVAGINAL NON-OB
1 series · 15 of 25 positions shown · non-contrast
Comparison: None

CLINICAL DATA: Enlarged uterus. Fibroids. This functional uterine
bleeding.

EXAM:
TRANSABDOMINAL AND TRANSVAGINAL ULTRASOUND OF PELVIS
TECHNIQUE: Both transabdominal and transvaginal ultrasound examinations of the
pelvis were performed. Transabdominal technique was performed for
global imaging of the pelvis including uterus, ovaries, adnexal
regions, and pelvic cul-de-sac. It was necessary to proceed with
endovaginal exam following the transabdominal exam to visualize the
uterus, endometrium and ovaries..

[Series 1: us transvaginal non-ob · 15 of 86 slices shown]
[im 1/86]
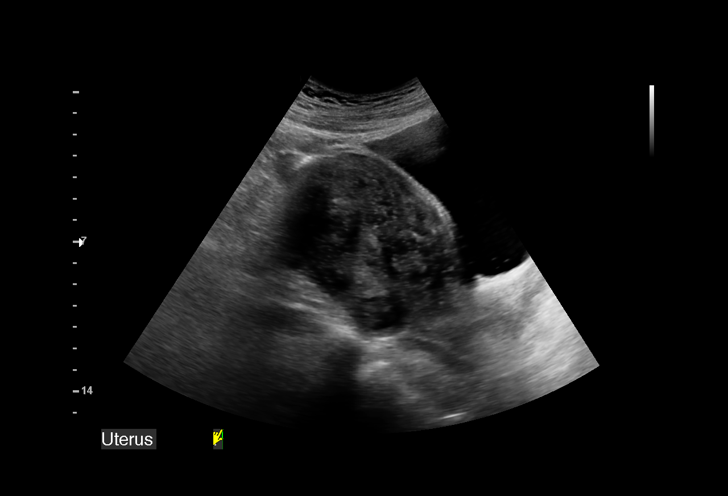
[im 8/86]
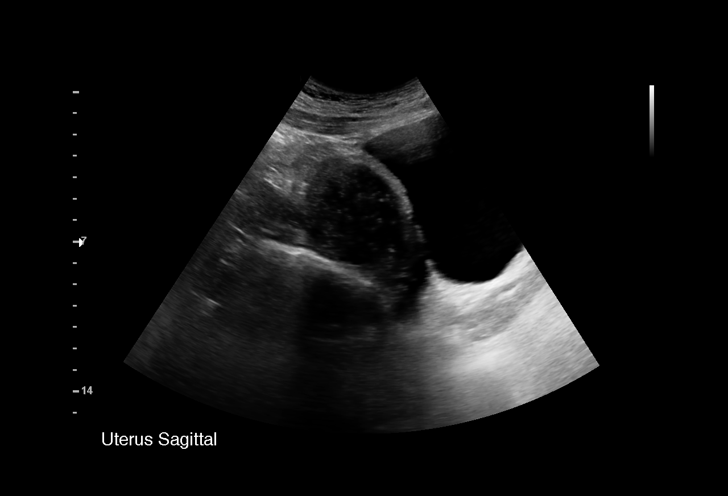
[im 15/86]
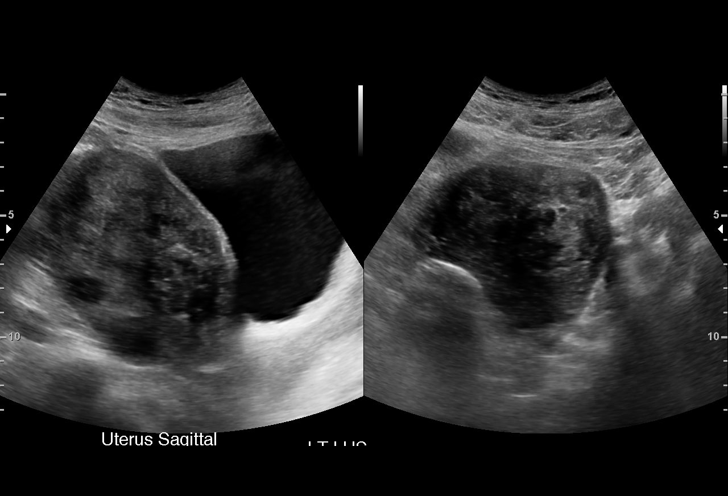
[im 18/86]
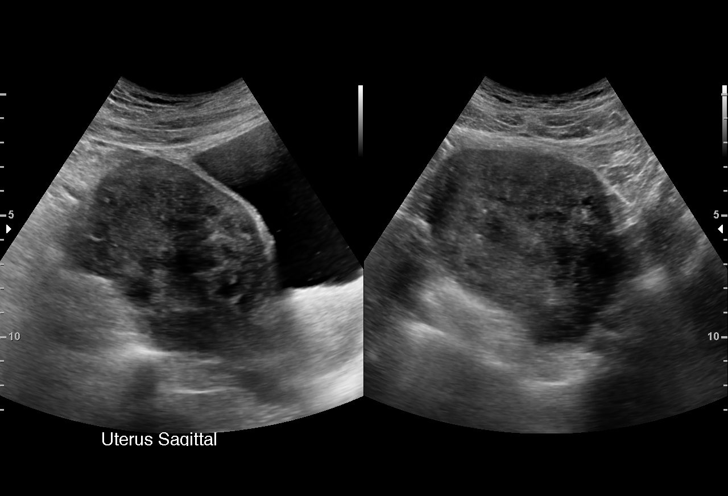
[im 25/86]
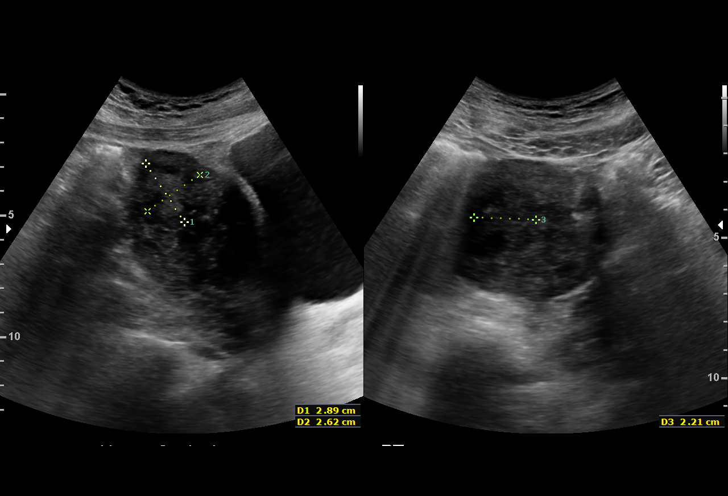
[im 32/86]
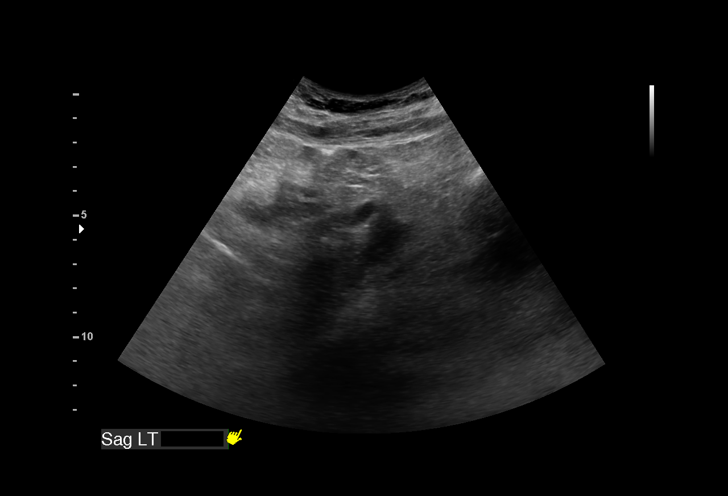
[im 36/86]
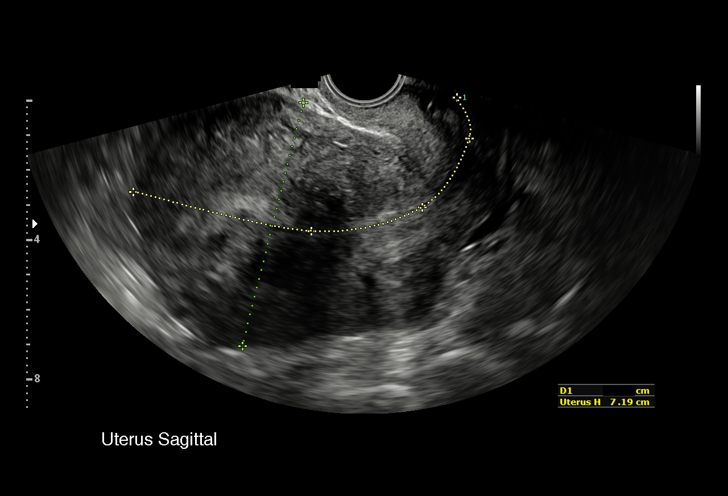
[im 43/86]
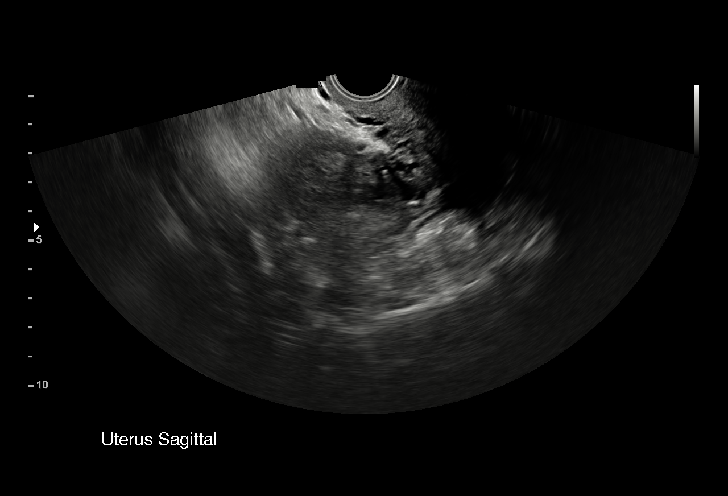
[im 50/86]
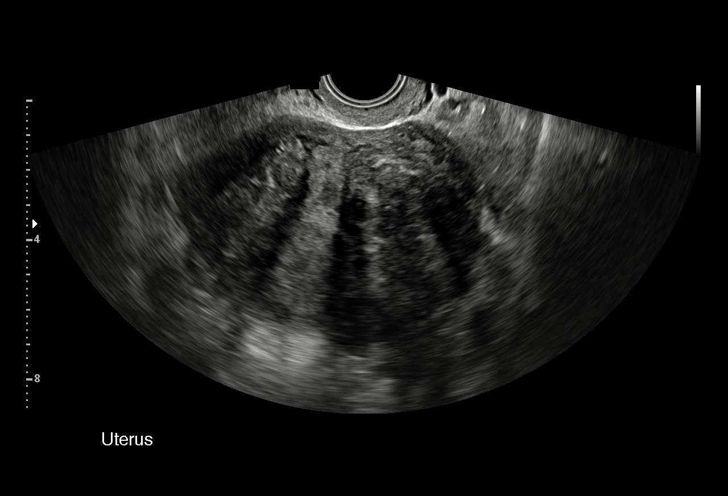
[im 54/86]
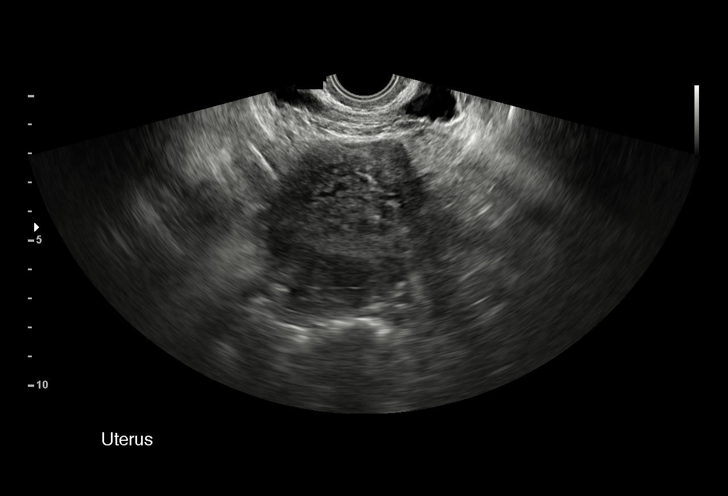
[im 61/86]
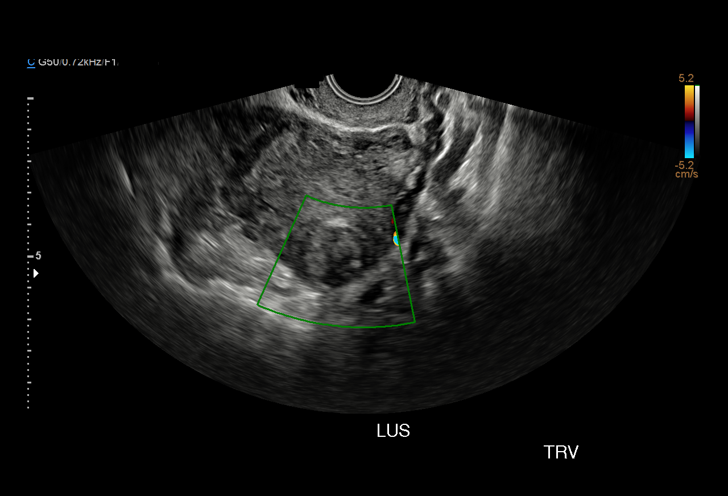
[im 68/86]
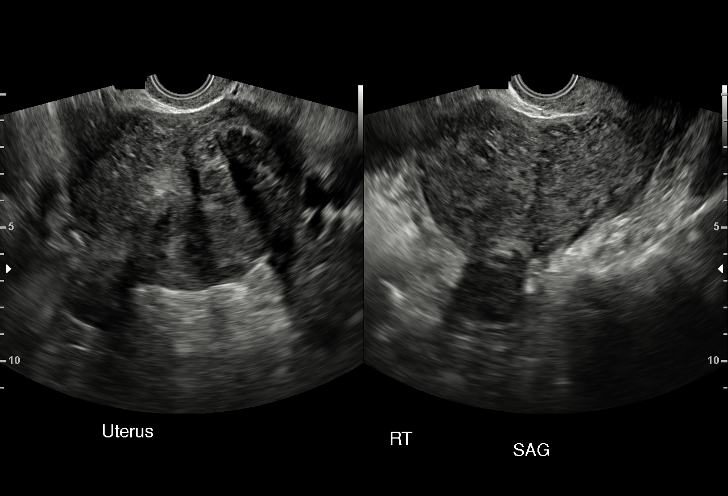
[im 71/86]
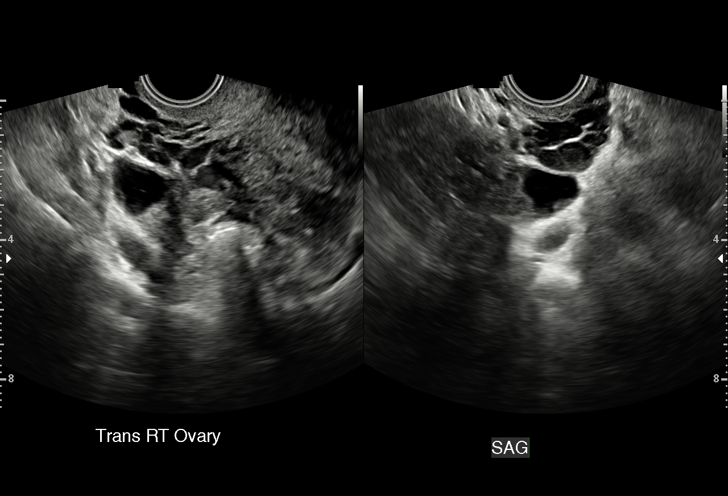
[im 78/86]
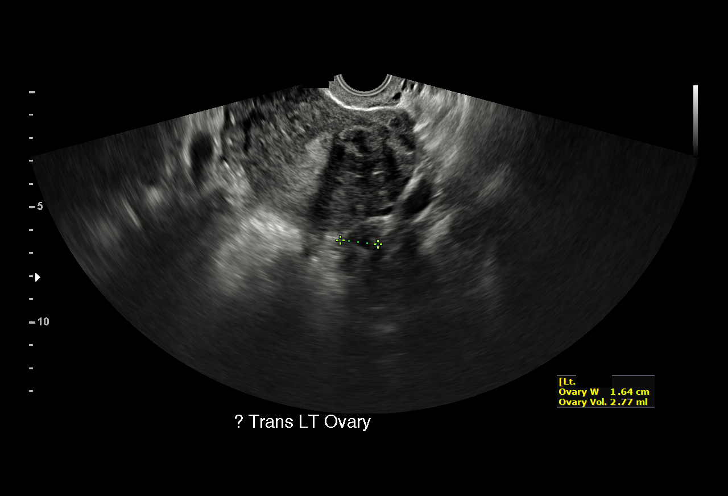
[im 86/86]
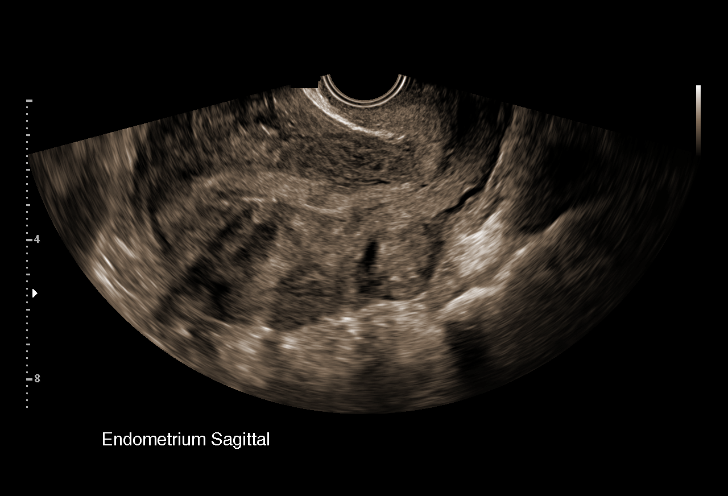

[15 of 25 positions shown; findings below may reference images not displayed]

FINDINGS: Uterus

Measurements: 12.2 x 7.2 x 8.6 cm (volume = 400 cm^3). Multiple
large fibroids are identified including a 3.6 x 3.0 x 2.8 cm
subserosal fibroid arising from the right anterior wall. Right
posterior intramural fibroid measures 2.8 x 2.3 x 2.8 cm. Within the
anterior lower uterine segment on the left there is a 3.0 x 2.5 x
3.2 cm fibroid.

Endometrium

Thickness: Measures 7-10 mm. Mildly heterogeneous. No focal
abnormality

Right ovary

Measurements: 2.2 x 1.4 x 1.6 cm. Normal appearance/no adnexal mass.

Left ovary

Measurements: 2.2 x 1.4 x 1.6 cm. Normal appearance/no adnexal mass.

Other findings

No abnormal free fluid.
IMPRESSION: 1. Fibroid uterus has a volume of approximately 400 cc.
2. No acute findings identified.

## 2018-12-23 ENCOUNTER — Other Ambulatory Visit: Payer: Self-pay

## 2018-12-23 ENCOUNTER — Ambulatory Visit (HOSPITAL_COMMUNITY)
Admission: EM | Admit: 2018-12-23 | Discharge: 2018-12-23 | Disposition: A | Discharge status: Home / Self Care | Payer: Medicaid Other | Attending: Physician Assistant | Admitting: Physician Assistant

## 2018-12-23 DIAGNOSIS — Z3202 Encounter for pregnancy test, result negative: Secondary | ICD-10-CM

## 2018-12-23 DIAGNOSIS — R300 Dysuria: Secondary | ICD-10-CM

## 2018-12-23 DIAGNOSIS — R1032 Left lower quadrant pain: Secondary | ICD-10-CM

## 2018-12-23 DIAGNOSIS — K5900 Constipation, unspecified: Secondary | ICD-10-CM

## 2018-12-23 LAB — POCT URINALYSIS DIP (DEVICE)
Bilirubin Urine: NEGATIVE
Glucose, UA: NEGATIVE mg/dL
Hgb urine dipstick: NEGATIVE
Ketones, ur: NEGATIVE mg/dL
Leukocytes,Ua: NEGATIVE
Nitrite: NEGATIVE
Protein, ur: NEGATIVE mg/dL
Specific Gravity, Urine: >=1.03 (ref 1.005–1.030)
Urobilinogen, UA: 0.2 mg/dL (ref 0.0–1.0)
pH: 6 (ref 5.0–8.0)

## 2018-12-23 LAB — POCT PREGNANCY, URINE: Preg Test, Ur: NEGATIVE

## 2018-12-23 MED ORDER — PHENAZOPYRIDINE HCL 200 MG PO TABS: 200.0000 mg | ORAL_TABLET | Freq: Three times a day (TID) | ORAL | 0 refills | Status: DC

## 2018-12-23 MED ORDER — POLYETHYLENE GLYCOL 3350 17 G PO PACK: 17.0000 g | PACK | Freq: Every day | ORAL | 0 refills | Status: DC

## 2018-12-23 MED ORDER — DOCUSATE SODIUM 50 MG PO CAPS: 50.0000 mg | ORAL_CAPSULE | Freq: Two times a day (BID) | ORAL | 0 refills | Status: DC

## 2018-12-23 NOTE — ED Provider Notes (Signed)
[place]    CSN: [id number] Arrival date & time: 12/23/18  1440     History   Chief Complaint No chief complaint on file.   HPI Christine Jimenez is a 48 y.o. female.   48 year old female comes in with 3 week history of LLQ pain. Pain is intermittent, worse with urination and bowel movement. She has had dysuria without obvious frequency, urgency, hematuria. Denies back/flank pain. Nausea without vomiting. Denies vaginal symptoms such as discharge, itching, pain. She has been having constipation with infrequent BMs and need for straining. LMP sometime last month, unsure.       Past Medical History:  Diagnosis Date  . 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 Hx LEEP (loop electrosurgical excision procedure), cervix, pregnancy 2009    Patient Active Problem List   Diagnosis Date Noted  . Morbid obesity ([place]) 03/08/2018  . Symptomatic anemia 11/01/2017  . Anemia 10/28/2017  . Abnormal uterine bleeding (AUB) 07/26/2017  . 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 History of abnormal Pap smear 07/07/2011    Past Surgical History:  Procedure Laterality Date  . EXCISION OF KELOID     twice.  Dr. Towanda Malkin, plastic surgery    OB History    Gravida  6   Para  6   Term  6   Preterm      AB      Living  6     SAB      TAB      Ectopic      Multiple      Live Births               Home Medications    Prior to Admission medications   Medication Sig Start Date End Date Taking? Authorizing Provider  docusate sodium (COLACE) 50 MG capsule Take 1 capsule (50 mg total) by mouth 2 (two) times daily. 12/23/18   Tasia Catchings, Breck Hollinger V, PA-C  phenazopyridine (PYRIDIUM) 200 MG tablet Take 1 tablet (200 mg total) by mouth 3 (three) times daily. 12/23/18   Tasia Catchings, Iyanna Drummer V, PA-C  polyethylene glycol (MIRALAX) 17 g packet Take 17 g by mouth daily. 12/23/18   Tasia Catchings, Zyah Gomm V, PA-C  ferrous sulfate (FERROUSUL) 325 (65 FE) MG tablet Take 1 tablet (325 mg total) by mouth 2 (two)  times daily. Patient not taking: Reported on 03/08/2018 11/02/17 12/23/18  Constant, Peggy, MD    Family History Family History  Problem Relation Age of Onset  . Diabetes Mother   . Heart disease Mother   . Hypertension Mother   . Heart disease Father   . Hypertension Father     Social History Social History   Tobacco Use  . Smoking status: Never Smoker  . Smokeless tobacco: Never Used  Substance Use Topics  . Alcohol use: Yes    Comment: occassional   . Drug use: No     Allergies   Patient has no known allergies.   Review of Systems Review of Systems  Reason unable to perform ROS: See HPI as above.     Physical Exam Triage Vital Signs ED Triage Vitals  Enc Vitals Group     BP      Pulse      Resp      Temp      Temp src      SpO2      Weight      Height      Head  Circumference      Peak Flow      Pain Score      Pain Loc      Pain Edu?      Excl. in [place]?    No data found.  Updated Vital Signs BP 134/68   Pulse 82   Temp 98.7 F (37.1 C)   SpO2 100%   Physical Exam Constitutional:      General: She is not in acute distress.    Appearance: She is well-developed. She is not ill-appearing, toxic-appearing or diaphoretic.  HENT:     Head: Normocephalic and atraumatic.  Eyes:     Conjunctiva/sclera: Conjunctivae normal.     Pupils: Pupils are equal, round, and reactive to light.  Cardiovascular:     Rate and Rhythm: Normal rate and regular rhythm.     Heart sounds: Normal heart sounds. No murmur. No friction rub. No gallop.   Pulmonary:     Effort: Pulmonary effort is normal.     Breath sounds: Normal breath sounds. No wheezing or rales.  Abdominal:     General: Bowel sounds are normal.     Palpations: Abdomen is soft.     Tenderness: There is no abdominal tenderness. There is no right CVA tenderness, left CVA tenderness, guarding or rebound.  Skin:    General: Skin is warm and dry.  Neurological:     Mental Status: She is alert and  oriented to person, place, and time.  Psychiatric:        Behavior: Behavior normal.        Judgment: Judgment normal.      UC Treatments / Results  Labs (all labs ordered are listed, but only abnormal results are displayed) Labs Reviewed  POC URINE PREG, ED  POCT URINALYSIS DIP (DEVICE)  POCT PREGNANCY, URINE    EKG   Radiology No results found.  Procedures Procedures (including critical care time)  Medications Ordered in UC Medications - No data to display  Initial Impression / Assessment and Plan / UC Course  I have reviewed the triage vital signs and the nursing notes.  Pertinent labs & imaging results that were available during my care of the patient were reviewed by me and considered in my medical decision making (see chart for details).     No alarming signs on exam. Patient nontender on palpation. Urine negative for infection, pregnancy. Will treat for constipation with miralax/colace as directed. Pyridium for dysuria. Push fluids. Return precautions given. Patient expresses understanding and agrees to plan.  Final Clinical Impressions(s) / UC Diagnoses   Final diagnoses:  LLQ pain  Dysuria    ED Prescriptions    Medication Sig Dispense Auth. Provider   polyethylene glycol (MIRALAX) 17 g packet Take 17 g by mouth daily. 24 each Tasia Catchings, Jamonta Goerner V, PA-C   docusate sodium (COLACE) 50 MG capsule Take 1 capsule (50 mg total) by mouth 2 (two) times daily. 10 capsule Irelynn Schermerhorn V, PA-C   phenazopyridine (PYRIDIUM) 200 MG tablet Take 1 tablet (200 mg total) by mouth 3 (three) times daily. 6 tablet Tobin Chad, [place] 12/23/18 2305

## 2018-12-23 NOTE — Discharge Instructions (Signed)
Urine negative for infection. Start pyridium for burning sensation. Keep hydrated, urine should be clear to pale yellow in color. Miralax for bowel movement. If needed you can add on colace. Follow up with PCP if symptoms not improving. If experiencing worsening symptoms, worsening abdominal pain, nausea/vomiting, unwilling to jump up and down due to pain, fever, go to the ED for further evaluation needed.

## 2018-12-23 NOTE — ED Notes (Signed)
Patient seen by provider only

## 2019-09-21 ENCOUNTER — Ambulatory Visit: Payer: Medicaid Other | Admitting: Medical

## 2019-12-26 ENCOUNTER — Ambulatory Visit: Payer: Medicaid Other | Admitting: Nurse Practitioner

## 2020-02-04 ENCOUNTER — Ambulatory Visit: Payer: Medicaid Other | Admitting: Certified Nurse Midwife

## 2020-04-21 ENCOUNTER — Other Ambulatory Visit: Payer: Self-pay

## 2020-04-21 ENCOUNTER — Other Ambulatory Visit (HOSPITAL_COMMUNITY)
Admission: RE | Admit: 2020-04-21 | Discharge: 2020-04-21 | Disposition: A | Discharge status: Home / Self Care | Payer: Medicaid Other | Source: Ambulatory Visit | Attending: Obstetrics & Gynecology | Admitting: Obstetrics & Gynecology

## 2020-04-21 ENCOUNTER — Encounter: Payer: Self-pay | Admitting: Obstetrics & Gynecology

## 2020-04-21 ENCOUNTER — Ambulatory Visit (INDEPENDENT_AMBULATORY_CARE_PROVIDER_SITE_OTHER): Payer: Medicaid Other | Admitting: Obstetrics & Gynecology

## 2020-04-21 VITALS — BP 141/91 | HR 88 | Ht 66.0 in | Wt 190.6 lb

## 2020-04-21 DIAGNOSIS — F4321 Adjustment disorder with depressed mood: Secondary | ICD-10-CM | POA: Diagnosis not present

## 2020-04-21 DIAGNOSIS — Z01419 Encounter for gynecological examination (general) (routine) without abnormal findings: Secondary | ICD-10-CM | POA: Insufficient documentation

## 2020-04-21 DIAGNOSIS — D500 Iron deficiency anemia secondary to blood loss (chronic): Secondary | ICD-10-CM

## 2020-04-21 DIAGNOSIS — Z01411 Encounter for gynecological examination (general) (routine) with abnormal findings: Secondary | ICD-10-CM

## 2020-04-21 DIAGNOSIS — F329 Major depressive disorder, single episode, unspecified: Secondary | ICD-10-CM

## 2020-04-21 DIAGNOSIS — N920 Excessive and frequent menstruation with regular cycle: Secondary | ICD-10-CM

## 2020-04-21 DIAGNOSIS — D259 Leiomyoma of uterus, unspecified: Secondary | ICD-10-CM

## 2020-04-21 DIAGNOSIS — Z1231 Encounter for screening mammogram for malignant neoplasm of breast: Secondary | ICD-10-CM

## 2020-04-21 DIAGNOSIS — F5105 Insomnia due to other mental disorder: Secondary | ICD-10-CM

## 2020-04-21 DIAGNOSIS — D219 Benign neoplasm of connective and other soft tissue, unspecified: Secondary | ICD-10-CM

## 2020-04-21 DIAGNOSIS — Z862 Personal history of diseases of the blood and blood-forming organs and certain disorders involving the immune mechanism: Secondary | ICD-10-CM

## 2020-04-21 MED ORDER — HYDROXYZINE HCL 50 MG PO TABS: 50.0000 mg | ORAL_TABLET | Freq: Four times a day (QID) | ORAL | 2 refills | Status: DC | PRN

## 2020-04-21 NOTE — Progress Notes (Signed)
GYNECOLOGY ANNUAL PREVENTATIVE CARE ENCOUNTER NOTE  History:     Christine Jimenez is a 49 y.o. [phone] female here for a routine annual gynecologic exam.  Current complaints: her mother recently passed away. She is battling with reactive depression wants to see about getting on some medicine to help her cope and sleep.  Also reports continued heavy bleeding during menses (not as bad as before when she was transfused for anemia in 2019) but still there. Associated with feeling tired, and eating ice. Wants lab evaluation. Also wants her fibroids rechecked.  Denies abnormal vaginal discharge, pelvic pain, problems with intercourse or other gynecologic concerns.    Gynecologic History Patient's last menstrual period was 04/07/2020 (within days). Contraception: none Last Pap: 07/26/2017. Results were: normal with negative HPV Last mammogram: 10/24/2014. Results were: normal  Obstetric History OB History  Gravida Para Term Preterm AB Living  6 6 6     6   SAB TAB Ectopic Multiple Live Births               # Outcome Date GA Lbr Len/2nd Weight Sex Delivery Anes PTL Lv  6 Term           5 Term           4 Term           3 Term           2 Term           1 Term             Past Medical History:  Diagnosis Date  . 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 Hx LEEP (loop electrosurgical excision procedure), cervix, pregnancy 2009    Past Surgical History:  Procedure Laterality Date  . EXCISION OF KELOID     twice.  Dr. Towanda Malkin, plastic surgery    Current Outpatient Medications on File Prior to Visit  Medication Sig Dispense Refill  . docusate sodium (COLACE) 50 MG capsule Take 1 capsule (50 mg total) by mouth 2 (two) times daily. (Patient not taking: Reported on 04/21/2020) 10 capsule 0  . phenazopyridine (PYRIDIUM) 200 MG tablet Take 1 tablet (200 mg total) by mouth 3 (three) times daily. (Patient not taking: Reported on 04/21/2020) 6 tablet 0  . polyethylene  glycol (MIRALAX) 17 g packet Take 17 g by mouth daily. (Patient not taking: Reported on 04/21/2020) 24 each 0  . [DISCONTINUED] ferrous sulfate (FERROUSUL) 325 (65 FE) MG tablet Take 1 tablet (325 mg total) by mouth 2 (two) times daily. (Patient not taking: Reported on 03/08/2018) 60 tablet 1   No current facility-administered medications on file prior to visit.    No Known Allergies  Social History:  reports that she has never smoked. She has never used smokeless tobacco. She reports current alcohol use. She reports that she does not use drugs.  Family History  Problem Relation Age of Onset  . Diabetes Mother   . Heart disease Mother   . Hypertension Mother   . Heart disease Father   . Hypertension Father     The following portions of the patient's history were reviewed and updated as appropriate: allergies, current medications, past family history, past medical history, past social history, past surgical history and problem list.  Review of Systems Pertinent items noted in HPI and remainder of comprehensive ROS otherwise negative.  Physical Exam:  BP (!) 141/91   Pulse 88   Ht 5\' 6"  (  1.676 m)   Wt 190 lb 9.6 oz (86.5 kg)   LMP 04/07/2020 (Within Days)   BMI 30.76 kg/m  CONSTITUTIONAL: Well-developed, well-nourished female in no acute distress.  HENT:  Normocephalic, atraumatic, External right and left ear normal. Oropharynx is clear and moist EYES: Conjunctivae and EOM are normal. Pupils are equal, round, and reactive to light. No scleral icterus.  NECK: Normal range of motion, supple, no masses.  Normal thyroid.  SKIN: Skin is warm and dry. No rash noted. Not diaphoretic. No erythema. No pallor. MUSCULOSKELETAL: Normal range of motion. No tenderness.  No cyanosis, clubbing, or edema.  2+ distal pulses. NEUROLOGIC: Alert and oriented to person, place, and time. Normal reflexes, muscle tone coordination.  PSYCHIATRIC: Normal mood and affect. Normal behavior. Normal judgment and  thought content. CARDIOVASCULAR: Normal heart rate noted, regular rhythm RESPIRATORY: Clear to auscultation bilaterally. Effort and breath sounds normal, no problems with respiration noted. BREASTS: Symmetric in size. No masses, tenderness, skin changes, nipple drainage, or lymphadenopathy bilaterally. Performed in the presence of a chaperone. ABDOMEN: Soft, no distention noted.  No tenderness, rebound or guarding.  PELVIC: Normal appearing external genitalia and urethral meatus; normal appearing vaginal mucosa and cervix.  No abnormal discharge noted.  Pap smear obtained.  Enlarged uterine size with fibroids, no other palpable masses, no uterine or adnexal tenderness.  Performed in the presence of a chaperone.   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  Patient verbally consented to [facility] services about presenting concerns and further consultation as appropriate. Referral made. Atarax prescribed for sleep for now, she was told this does nothing for the depression.  - Ambulatory referral to [facility] - hydrOXYzine (ATARAX/VISTARIL) 50 MG tablet; Take 1 tablet (50 mg total) by mouth every 6 (six) hours as needed for anxiety or itching (insomnia).  Dispense: 30 tablet; Refill: 2  3. Menorrhagia with regular cycle 4. Fibroids 5. History of anemia Will check labs and ultrasound.  May need to do endometrial biopsy or other intervention based on results, patient will be contacted.  - US PELVIC COMPLETE WITH TRANSVAGINAL; Future - CBC - Ferritin  6. Breast cancer screening by mammogram Mammogram scheduled - MM 3D SCREEN BREAST BILATERAL; Future  7. Encounter for annual routine gynecological examination - Cytology - PAP - CBC - TSH - Hemoglobin A1c - Lipid panel - Comprehensive metabolic panel Will follow up results  of pap smear and preventative health maintenance labs and manage accordingly. Routine preventative health maintenance measures emphasized. Please refer to After Visit Summary for other counseling recommendations.      Verita Schneiders, MD, [place] for [employer], [place]

## 2020-04-21 NOTE — Patient Instructions (Addendum)
Thank you for enrolling in MyChart. Please follow the instructions below to securely access your online medical record. MyChart allows you to send messages to your doctor, view your test results, manage appointments, and more.   How Do I Sign Up? 1. In your Internet browser, go to [employer] and enter [email or web address]. 2. Click on the Sign Up Now link in the Sign In box. You will see the New Member Sign Up page. 3. Enter your MyChart Access Code exactly as it appears below. You will not need to use this code after you've completed the sign-up process. If you do not sign up before the expiration date, you must request a new code.  MyChart Access Code: [email or web address] Expires: 06/05/2020 10:19 AM  4. Enter your Social Security Number ([id number]) and Date of Birth (mm/dd/yyyy) as indicated and click Submit. You will be taken to the next sign-up page. 5. Create a MyChart ID. This will be your MyChart login ID and cannot be changed, so think of one that is secure and easy to remember. 6. Create a MyChart password. You can change your password at any time. 7. Enter your Password Reset Question and Answer. This can be used at a later time if you forget your password.  8. Enter your e-mail address. You will receive e-mail notification when new information is available in MyChart. 9. Click Sign Up. You can now view your medical record.   Additional Information Remember, MyChart is NOT to be used for urgent needs. For medical emergencies, dial 911.      Preventive Care 37-49 Years Old, Female Preventive care refers to visits with your health care provider and lifestyle choices that can promote health and wellness. This includes:  A yearly physical exam. This may also be called an annual well check.  Regular dental visits and eye exams.  Immunizations.  Screening for certain conditions.  Healthy lifestyle choices, such as eating a healthy diet, getting regular exercise,  not using drugs or products that contain nicotine and tobacco, and limiting alcohol use. What can I expect for my preventive care visit? Physical exam Your health care provider will check your:  Height and weight. This may be used to calculate body mass index (BMI), which tells if you are at a healthy weight.  Heart rate and blood pressure.  Skin for abnormal spots. Counseling Your health care provider may ask you questions about your:  Alcohol, tobacco, and drug use.  Emotional well-being.  Home and relationship well-being.  Sexual activity.  Eating habits.  Work and work Astronomer.  Method of birth control.  Menstrual cycle.  Pregnancy history. What immunizations do I need?  Influenza (flu) vaccine  This is recommended every year. Tetanus, diphtheria, and pertussis (Tdap) vaccine  You may need a Td booster every 10 years. Varicella (chickenpox) vaccine  You may need this if you have not been vaccinated. Zoster (shingles) vaccine  You may need this after age 4. Measles, mumps, and rubella (MMR) vaccine  You may need at least one dose of MMR if you were born in 1957 or later. You may also need a second dose. Pneumococcal conjugate (PCV13) vaccine  You may need this if you have certain conditions and were not previously vaccinated. Pneumococcal polysaccharide (PPSV23) vaccine  You may need one or two doses if you smoke cigarettes or if you have certain conditions. Meningococcal conjugate (MenACWY) vaccine  You may need this if you have certain conditions. Hepatitis A vaccine  You may need this  if you have certain conditions or if you travel or work in places where you may be exposed to hepatitis A. Hepatitis B vaccine  You may need this if you have certain conditions or if you travel or work in places where you may be exposed to hepatitis B. Haemophilus influenzae type b (Hib) vaccine  You may need this if you have certain conditions. Human  papillomavirus (HPV) vaccine  If recommended by your health care provider, you may need three doses over 6 months. You may receive vaccines as individual doses or as more than one vaccine together in one shot (combination vaccines). Talk with your health care provider about the risks and benefits of combination vaccines. What tests do I need? Blood tests  Lipid and cholesterol levels. These may be checked every 5 years, or more frequently if you are over 40 years old.  Hepatitis C test.  Hepatitis B test. Screening  Lung cancer screening. You may have this screening every year starting at age 55 if you have a 30-pack-year history of smoking and currently smoke or have quit within the past 15 years.  Colorectal cancer screening. All adults should have this screening starting at age 46 and continuing until age 16. Your health care provider may recommend screening at age 45 if you are at increased risk. You will have tests every 1-10 years, depending on your results and the type of screening test.  Diabetes screening. This is done by checking your blood sugar (glucose) after you have not eaten for a while (fasting). You may have this done every 1-3 years.  Mammogram. This may be done every 1-2 years. Talk with your health care provider about when you should start having regular mammograms. This may depend on whether you have a family history of breast cancer.  BRCA-related cancer screening. This may be done if you have a family history of breast, ovarian, tubal, or peritoneal cancers.  Pelvic exam and Pap test. This may be done every 3 years starting at age 2. Starting at age 3, this may be done every 5 years if you have a Pap test in combination with an HPV test. Other tests  Sexually transmitted disease (STD) testing.  Bone density scan. This is done to screen for osteoporosis. You may have this scan if you are at high risk for osteoporosis. Follow these instructions at home: Eating  and drinking  Eat a diet that includes fresh fruits and vegetables, whole grains, lean protein, and low-fat dairy.  Take vitamin and mineral supplements as recommended by your health care provider.  Do not drink alcohol if: ? Your health care provider tells you not to drink. ? You are pregnant, may be pregnant, or are planning to become pregnant.  If you drink alcohol: ? Limit how much you have to 0-1 drink a day. ? Be aware of how much alcohol is in your drink. In the [place] one drink equals one 12 oz bottle of beer (355 mL), one 5 oz glass of wine (148 mL), or one 1 oz glass of hard liquor (44 mL). Lifestyle  Take daily care of your teeth and gums.  Stay active. Exercise for at least 30 minutes on 5 or more days each week.  Do not use any products that contain nicotine or tobacco, such as cigarettes, e-cigarettes, and chewing tobacco. If you need help quitting, ask your health care provider.  If you are sexually active, practice safe sex. Use a condom or other form of birth  control (contraception) in order to prevent pregnancy and STIs (sexually transmitted infections).  If told by your health care provider, take low-dose aspirin daily starting at age 44. What's next?  Visit your health care provider once a year for a well check visit.  Ask your health care provider how often you should have your eyes and teeth checked.  Stay up to date on all vaccines. This information is not intended to replace advice given to you by your health care provider. Make sure you discuss any questions you have with your health care provider. Document Revised: 02/09/2018 Document Reviewed: 02/09/2018 Elsevier Patient Education  2020 [employer].

## 2020-04-22 ENCOUNTER — Telehealth: Payer: Self-pay | Admitting: Lactation Services

## 2020-04-22 ENCOUNTER — Encounter: Payer: Self-pay | Admitting: Obstetrics & Gynecology

## 2020-04-22 LAB — COMPREHENSIVE METABOLIC PANEL
ALT: 7 IU/L (ref 0–32)
AST: 15 IU/L (ref 0–40)
Albumin/Globulin Ratio: 1.4 (ref 1.2–2.2)
Albumin: 4.5 g/dL (ref 3.8–4.8)
Alkaline Phosphatase: 59 IU/L (ref 44–121)
BUN/Creatinine Ratio: 17 (ref 9–23)
BUN: 14 mg/dL (ref 6–24)
Bilirubin Total: 0.4 mg/dL (ref 0.0–1.2)
CO2: 19 mmol/L — ABNORMAL LOW (ref 20–29)
Calcium: 9 mg/dL (ref 8.7–10.2)
Chloride: 102 mmol/L (ref 96–106)
Creatinine, Ser: 0.84 mg/dL (ref 0.57–1.00)
GFR calc Af Amer: 94 mL/min/{1.73_m2} (ref >59)
GFR calc non Af Amer: 81 mL/min/{1.73_m2} (ref >59)
Globulin, Total: 3.3 g/dL (ref 1.5–4.5)
Glucose: 94 mg/dL (ref 65–99)
Potassium: 4.1 mmol/L (ref 3.5–5.2)
Sodium: 135 mmol/L (ref 134–144)
Total Protein: 7.8 g/dL (ref 6.0–8.5)

## 2020-04-22 LAB — CBC
Hematocrit: 26.7 % — ABNORMAL LOW (ref 34.0–46.6)
Hemoglobin: 7.7 g/dL — ABNORMAL LOW (ref 11.1–15.9)
MCH: 17.5 pg — ABNORMAL LOW (ref 26.6–33.0)
MCHC: 28.8 g/dL — ABNORMAL LOW (ref 31.5–35.7)
MCV: 61 fL — ABNORMAL LOW (ref 79–97)
Platelets: 413 10*3/uL (ref 150–450)
RBC: 4.4 x10E6/uL (ref 3.77–5.28)
RDW: 22 % — ABNORMAL HIGH (ref 11.7–15.4)
WBC: 6.2 10*3/uL (ref 3.4–10.8)

## 2020-04-22 LAB — LIPID PANEL
Chol/HDL Ratio: 2.7 ratio (ref 0.0–4.4)
Cholesterol, Total: 164 mg/dL (ref 100–199)
HDL: 60 mg/dL (ref >39)
LDL Chol Calc (NIH): 93 mg/dL (ref 0–99)
Triglycerides: 57 mg/dL (ref 0–149)
VLDL Cholesterol Cal: 11 mg/dL (ref 5–40)

## 2020-04-22 LAB — FERRITIN: Ferritin: 2 ng/mL — ABNORMAL LOW (ref 15–150)

## 2020-04-22 LAB — HEMOGLOBIN A1C
Est. average glucose Bld gHb Est-mCnc: 117 mg/dL
Hgb A1c MFr Bld: 5.7 % — ABNORMAL HIGH (ref 4.8–5.6)

## 2020-04-22 LAB — TSH: TSH: 2.68 u[IU]/mL (ref 0.450–4.500)

## 2020-04-22 NOTE — Telephone Encounter (Signed)
Called and spoke with patient and gave her information on her A1C. reviewed that it is recommended she follow up with a PCP to manage. Patient reports she has Advice worker. She is planning to reach out to her Medicaid worker to discuss applying for Medicaid. The will then ask Medicaid case worker for list of PCP's or get one from this office. Patient voiced understanding.

## 2020-04-22 NOTE — Telephone Encounter (Signed)
Called patient to give results and patient did not answer. LM for patient to call the office for results and recommendations.

## 2020-04-22 NOTE — Telephone Encounter (Signed)
-----   Message from Osborne Oman, MD sent at 04/22/2020 11:10 AM EST ----- Patient has significant iron deficiency anemia (hemoglobin 7.7 and ferritin 2)  If she is very symptomatic, she can get outpatient blood transfusion (2 units of pRBCs, not ordered yet).  Alternatively, she can get three weekly doses of Feraheme (orders signed and held).  Please call to inform patient of results and recommendations, and make arrangements for whichever option she chooses.   Patient will need an appointment with me after her ultrasound on 05/05/2020 to discuss further management given her continued AUB and fibroids.    Thank you.  Verita Schneiders, MD, [place] for [employer], [place]

## 2020-04-22 NOTE — Telephone Encounter (Signed)
Called patient again to inform her of Hgb and Ferritin levels. She reports she is tired and SOB at times. She reports she has some dizziness and lightheadedness. She reports she is still working and sleeping a lot more.   Reviewed her getting a blood transfusion vs Fereheme. Patient reports she has had a blood transfusion in the past and would prefer to try the iron infusions. Reviewed with her that the Iron infusions may take longer to see the difference in her body, patient voiced understanding.   Patient informed she needs to follow up with Dr. Harolyn Rutherford after her [place] on 11/22 and front office will be calling to scheduled. Patient voiced understanding. Message to front office to call patient to schedule.   Called infusion Center at [facility], they do not have an appt until December. Called Short Stay Unit at [facility] and was not able to reach. LM for them to call the office to schedule Fereheme infusions. Patient is open to any day, she prefers morning. She is aware she needs once a week for 3 weeks.

## 2020-04-22 NOTE — Telephone Encounter (Signed)
-----   Message from Osborne Oman, MD sent at 04/22/2020 11:11 AM EST ----- Prediabetes range of Hemoglobin A1C. Patient needs diet and exercise counseling, and needs to follow up with a primary care physician for further management. Please call to inform patient of results and recommendations.

## 2020-04-22 NOTE — Addendum Note (Signed)
Addended by: Verita Schneiders A on: 04/22/2020 11:10 AM   Modules accepted: Orders

## 2020-04-22 NOTE — Telephone Encounter (Signed)
Attempted to call patient to give results. Patient did not answer. LM for patient to call the office for results and recommendations.

## 2020-04-22 NOTE — Progress Notes (Signed)
Patient has significant iron deficiency anemia (hemoglobin 7.7 and ferritin 2)  If she is very symptomatic, she can get outpatient blood transfusion (2 units of pRBCs, not ordered yet).  Alternatively, she can get three weekly doses of Feraheme (orders signed and held).  Please call to inform patient of results and recommendations, and make arrangements for whichever option she chooses.   Patient will need an appointment with me after her ultrasound on 05/05/2020 to discuss further management given her continued AUB and fibroids.    Thank you.  Verita Schneiders, MD, [place] for [employer], [place]

## 2020-04-23 NOTE — Telephone Encounter (Signed)
Called Christine Jimenez at [facility] short stay , scheduled infusion for 04/30/2020 @10am  called patient to advise of appointment, left a voicemail that we scheduled an appointment for her and to call us back for the appointment information.

## 2020-04-23 NOTE — Telephone Encounter (Signed)
Pt returned our call today @ 1200 and requested to be called back regarding an appt. I called her @ 1415 and left detailed message on her voicemail. I provided the information for iron infusion Madilyn Hook) appt on 11/17 @ 1000 @ [facility]. She should arrive @ 0945. Pt may call back if she has questions.

## 2020-04-24 ENCOUNTER — Ambulatory Visit: Payer: Medicaid Other

## 2020-04-24 LAB — CYTOLOGY - PAP
Comment: NEGATIVE
Diagnosis: NEGATIVE
Diagnosis: REACTIVE
High risk HPV: NEGATIVE

## 2020-04-29 NOTE — BH Specialist Note (Signed)
Integrated Behavioral Health via Telemedicine Video (Caregility) Visit  04/29/2020 Christine Jimenez [id number]  Number of [place] visits: 1 Session Start time: 2:16  Session End time: 2:31 Total time: 15 minutes  Referring Provider: Verita Schneiders, MD Type of Service: Individual Patient/Family location: [facility] Provider location: [facility] for [place] [facility]  All persons participating in visit: Patient Christine Jimenez and Christine Jimenez   I connected with Christine Jimenez by a video enabled telemedicine application (Caregility) and verified that I am speaking with the correct person using two identifiers.   Discussed confidentiality: Yes   Confirmed demographics & insurance:  Yes   I discussed that engaging in this virtual visit, they consent to the provision of behavioral healthcare and the services will be billed under their insurance.   Patient and/or legal guardian expressed understanding and consented to virtual visit: Yes   PRESENTING CONCERNS: Patient and/or family reports the following symptoms/concerns: Pt states her primary concern today is financial stress (uninsured; risk of losing housing), fatigue, feeling down (attributed to continued bleeding, loss of mother 3 months ago, and job stress); pt requests [facility] financial paperwork and community resources to pick up at her upcoming visit on 05/15/20 at [facility]. Duration of problem: Ongoing; Severity of problem: moderate  STRENGTHS (Protective Factors/Coping Skills): Motivated to improve health  ASSESSMENT: Patient currently experiencing Grief and Psychosocial stress.    GOALS ADDRESSED: Patient will: 1.  Reduce symptoms of: anxiety, depression and stress  2.  Increase knowledge and/or ability of: stress reduction  3.  Demonstrate ability to: Increase healthy adjustment to current life circumstances and Continue healthy grieving over loss   Progress of  Goals: Ongoing  INTERVENTIONS: Interventions utilized:  Supportive Counseling, Psychoeducation and/or Health Education and Link to [employer] Standardized Assessments completed & reviewed: Not Needed   OUTCOME: Patient Response: Pt agrees to treatment plan   PLAN: 1. Follow up with behavioral health clinician on : Brief f/u in-person (with [place] or [facility] Intern(s) during 05/15/20 medical visit at [facility]) 2. Behavioral recommendations:  -Set up MyChart today; contact MyChart help desk at [phone] if needed -Continue with iron infusion tomorrow -Consider community resources on After Visit Summary (ready for in-person pick up on 05/15/20) 3. Referral(s): [place] (In Clinic) and [employer]:  Food, Housing and Transportation  I discussed the assessment and treatment plan with the patient and/or parent/guardian. They were provided an opportunity to ask questions and all were answered. They agreed with the plan and demonstrated an understanding of the instructions.   They were advised to call back or seek an in-person evaluation as appropriate.  I discussed that the purpose of this visit is to provide behavioral health care while limiting exposure to the novel coronavirus.  Discussed there is a possibility of technology failure and discussed alternative modes of communication if that failure occurs.  Christine Jimenez Christine Jimenez  Depression screen [facility] 2/9 04/21/2020 03/08/2018 07/26/2017  Decreased Interest 3 2 3   Down, Depressed, Hopeless 3 1 3   PHQ - 2 Score 6 3 6   Altered sleeping 2 2 3   Tired, decreased energy 2 2 3   Change in appetite 2 0 3  Feeling bad or failure about yourself  2 0 2  Trouble concentrating 1 1 2   Moving slowly or fidgety/restless 1 0 3  Suicidal thoughts 0 0 1  PHQ-9 Score 16 8 23    GAD 7 : Generalized Anxiety Score  04/21/2020 03/08/2018 07/26/2017  Nervous, Anxious, on Edge 2 0 1  Control/stop worrying 3 1 2    Worry too much - different things 3 1 3   Trouble relaxing 2 1 2   Restless 2 1 2   Easily annoyed or irritable 2 0 2  Afraid - awful might happen - 0 0  Total GAD 7 Score - 4 12

## 2020-04-30 ENCOUNTER — Other Ambulatory Visit: Payer: Self-pay

## 2020-04-30 ENCOUNTER — Ambulatory Visit (HOSPITAL_COMMUNITY)
Admission: RE | Admit: 2020-04-30 | Discharge: 2020-04-30 | Disposition: A | Discharge status: Home / Self Care | Payer: Medicaid Other | Source: Ambulatory Visit | Attending: Obstetrics & Gynecology | Admitting: Obstetrics & Gynecology

## 2020-04-30 DIAGNOSIS — D500 Iron deficiency anemia secondary to blood loss (chronic): Secondary | ICD-10-CM

## 2020-04-30 MED ORDER — SODIUM CHLORIDE 0.9 % IV SOLN
510.0000 mg | INTRAVENOUS | Status: DC
  Administered 2020-04-30: 510 mg via INTRAVENOUS
  Filled 2020-04-30: qty 17

## 2020-05-05 ENCOUNTER — Ambulatory Visit: Admission: RE | Admit: 2020-05-05 | Payer: Medicaid Other | Source: Ambulatory Visit

## 2020-05-06 ENCOUNTER — Ambulatory Visit (INDEPENDENT_AMBULATORY_CARE_PROVIDER_SITE_OTHER): Payer: Self-pay | Admitting: Clinical

## 2020-05-06 ENCOUNTER — Encounter (HOSPITAL_COMMUNITY)
Admission: RE | Admit: 2020-05-06 | Discharge: 2020-05-06 | Disposition: A | Discharge status: Home / Self Care | Payer: Medicaid Other | Source: Ambulatory Visit | Attending: Obstetrics & Gynecology | Admitting: Obstetrics & Gynecology

## 2020-05-06 ENCOUNTER — Other Ambulatory Visit: Payer: Self-pay

## 2020-05-06 DIAGNOSIS — F329 Major depressive disorder, single episode, unspecified: Secondary | ICD-10-CM

## 2020-05-06 DIAGNOSIS — F4321 Adjustment disorder with depressed mood: Secondary | ICD-10-CM

## 2020-05-06 DIAGNOSIS — Z658 Other specified problems related to psychosocial circumstances: Secondary | ICD-10-CM

## 2020-05-06 DIAGNOSIS — D500 Iron deficiency anemia secondary to blood loss (chronic): Secondary | ICD-10-CM | POA: Insufficient documentation

## 2020-05-06 MED ORDER — SODIUM CHLORIDE 0.9 % IV SOLN
510.0000 mg | INTRAVENOUS | Status: DC
  Administered 2020-05-06: 510 mg via INTRAVENOUS
  Filled 2020-05-06: qty 510

## 2020-05-06 NOTE — Patient Instructions (Addendum)
Center for [facility] at [facility] [place], [place] [address] [phone] (main office) [phone] [facility] office)  Christine Jimenez, below are resources for the following:  *Grief support *Housing Resources *Transportation Resources *[place] (Individual and group grief support)  [name]  [place] (serves [place], [place], [place], [place], [place], [place], [place], [place], [place], [place], [place], [place], and [place] counties) [address], [place], [place] [address] [phone] http://[email or web address]  **Rental assistance, Home Rehabilitation,Weatherization Assistance Program, Forensic psychologist, Housing Holly [address], [place], [place] [address] [phone] www.gha-[place].[facility] [address] Lin [place], [place] [address] [phone] https://manning.com/ **Programs include: Engineer, building services and Housing Counseling, Healthy Doctor, general practice, Homeless Prevention and [place] [address] [place], [place], [place] [address] [phone] www.https://www.farmer-stevens.info/ **housing applications/recertification; tax payment relief/exemption under specific qualifications  [facility] [address], [place], [place] [address] www.onlinegreensboro.com/~maryshouse **transitional housing for women in recovery who have minor children or are pregnant  [place], [place], [place] [address] ArtistMovie.se  **emergency shelter and support services for families facing homelessness  [place] [address] [place], [place] [address] [phone] www.youthfocus.org **transitional housing  to pregnant women; emergency housing for youth who have run away, are experiencing a family crisis, are victims of abuse or neglect, or are homeless  [facility] [address], [place], [place] [address] [phone] ircgso.org **Drop-in center for people experiencing homelessness; overnight warming center when temperature is 25 degrees or below  Re-Entry Staffing [place], [place], [place] [address] [phone] https://reentrystaffingagency.org/ **help with affordable housing to people experiencing homelessness or unemployment due to incarceration  [facility] [address], [place], [place] [address] [phone] www.greensborourbanministry.org  **emergency and transitional housing, rent/mortgage assistance, utility assistance  [facility] [address] [place], [place] [address] [phone] www.salvationarmyofgreensboro.org **emergency and transitional housing  Hab[employer] for [employer] [place], [place], [place] [address] [phone] Www.habitatgreensboro.[place], [place], [place] [address] [phone] https://chshousing.org **[facility]  [facility]ousing [facility]ants [facility] [address] [place] [address], [place], [place] [address] [phone] arrivance.com **home buyer education courses, foreclosure prevention  [place], [place], [place] [address] [phone] WirelessNovelties.no **Environmental Exposure Assessment (investigation of homes where either children or pregnant women with a confirmed elevated blood lead level reside)  [facility] of Vocational Rehabilitation-[place] [address] [place], [place] [address] [phone] http://www.perez.com/ **Home Expense Assistance/Repairs Program; offers home accessibility updates, such as ramps or bars in the bathroom  [facility]Help [facility] [address] [place], [place] [address] [phone] https://www.self-help.org/locations/[place]-branch **Offers credit-building and banking services to people unable to use traditional banking  Transportation Resources [place]) [place], [place], [place] [address] https://www.[place]-.gov/departments/transportation/gdot-divisions/[place]-transit-agency-public-transportation-division     . Fixed-route bus services, including regional fare cards for PART, Valentine, Rembrandt, and WSTA buses.  . Reduced fare bus ID's available for Medicaid, Medicare, and "orange card" recipients.  [place] Kitchen SCAT offers curb-to-curb and door-to-door bus services for people with disabilities who  are unable to use a fixed-bus route; also offers a shared-ride program.   Helpful tips:  -Routes available online and physical maps available at the main bus hub lobby (each for a specific route) -Smartphone directions often include bus routes (see the "bus" icon, next to the "car" and "walk" icons) -Routes differ on weekends, evenings and holidays, so plan ahead!  -If you have Medicaid, Medicare, or orange card, plan to obtain a reduced-fare ID to save 50% on rides. Check days and times to obtain an ID, and bring all necessary documents.   [employer] [place]) [place], [place]. [address], [place], [place] [address] [phone] http://www.smith-bell.org/ **Fixed-bus route services, and demand response bus service for older adults  Department of Social [facility] [address] [place], [place] [address] [phone] www.ProfileWatcher.fi **Medicaid  transportation is available to [facility] recipients who need assistance getting to [facility] medical appointments and providers  [facility] [address] [place] [address], [place], [place] [address] www.cjmedicaltransportation.com  ** Offers non-emergency transportation for medical appointments  Wheels [place], [place], [place] [address] [phone] www.wheels4hope.org **REFERRAL NEEDED by specific agencies (see website), after meeting specified criteria only  Mirant for [employer]) [address] [place], [place] [address] [phone]  BikerFestival.is  *Regional fixed-bus routes between counties (example: [place] to [place]) and [employer] of [place], [place], [place] [address] [phone] Http://senior-resources-guilford.org  Production assistant, radio available (call or see website for details)  [place] [address], [place], [place] [address] [phone]   or  www.http://james-garner.info/ **SNAP/EBT/ Other nutritional benefits  [facility] [address], [place], [place] [address] [phone]  or  https://palmer-smith.com/ **WIC for  women who are pregnant and postpartum, infants and children up to 26 years old  [place] [address] [place], [place] [address] [phone]   or   www.theblessedtable.org  **Food pantry  [employer] Kol[employer]'s [place], [place], [place] [address] [phone]   or   https://brotherkolbes.godaddysites.com  **Emergency food and prepared meals  [place] [address], [place], [place] [address] [phone]   or   www.cedargrovetop.us **Food pantry  [facility] [address], [place], [place] [address] [phone]   or   www.https://hartman-jones.net/ **Food pantry  [employer] [facility] [employer] [facility]ry [address], [place], [place] [address] [phone] **Food pantry  [facility] [address], [place], [place] [address] [phone]   or   www.greensborourbanministry.org  Insurance underwriter and prepared meals  [facility] [facility] [address] [place], [place], [place], [place] [address] DomainerFinder.be  **Food pantry  [place] [employer] [address] [place], [place] [address] [phone]   or   www.lbcnow.org  **Food pantry  One Step Further [address] [place], [place] [address] [phone]   or   http://patterson-parker.net/ **Food pantry, nutrition education, gardening activities  [place] [address], [place], [place] [address] [phone] **Food pantry  [facility] [address] [place], [place] [address] [phone]   or   www.salvationarmyofgreensboro.Lovette Cliche of [place], [place],  [address] [phone]   or   http://senior-resources-guilford.org [facility] [place] [address] [place],  [address] [phone]  or   www.stmattchurch.com  **Food pantry  [place] [address], [place], [place] [address] [phone]   or   vandaliapresbyterianchurch.org **Food pantry

## 2020-05-14 ENCOUNTER — Encounter (HOSPITAL_COMMUNITY): Payer: Self-pay

## 2020-05-15 ENCOUNTER — Other Ambulatory Visit: Payer: Self-pay

## 2020-05-15 ENCOUNTER — Encounter: Payer: Self-pay | Admitting: Obstetrics & Gynecology

## 2020-05-15 ENCOUNTER — Encounter (HOSPITAL_COMMUNITY)
Admission: RE | Admit: 2020-05-15 | Discharge: 2020-05-15 | Disposition: A | Discharge status: Home / Self Care | Payer: Medicaid Other | Source: Ambulatory Visit | Attending: Obstetrics & Gynecology | Admitting: Obstetrics & Gynecology

## 2020-05-15 ENCOUNTER — Ambulatory Visit (INDEPENDENT_AMBULATORY_CARE_PROVIDER_SITE_OTHER): Payer: Self-pay | Admitting: Obstetrics & Gynecology

## 2020-05-15 VITALS — BP 130/72 | HR 89 | Ht 65.0 in | Wt 199.0 lb

## 2020-05-15 DIAGNOSIS — D500 Iron deficiency anemia secondary to blood loss (chronic): Secondary | ICD-10-CM | POA: Insufficient documentation

## 2020-05-15 DIAGNOSIS — D219 Benign neoplasm of connective and other soft tissue, unspecified: Secondary | ICD-10-CM

## 2020-05-15 DIAGNOSIS — N920 Excessive and frequent menstruation with regular cycle: Secondary | ICD-10-CM

## 2020-05-15 MED ORDER — SODIUM CHLORIDE 0.9 % IV SOLN
510.0000 mg | INTRAVENOUS | Status: DC
  Administered 2020-05-15: 510 mg via INTRAVENOUS
  Filled 2020-05-15: qty 510

## 2020-05-15 NOTE — Progress Notes (Signed)
   GYNECOLOGY OFFICE VISIT NOTE  History:   Christine Jimenez is a 49 y.o. [phone] here today for follow up of menorrhagia and iron deficiency anemia.  Recent hemoglobin 7.7 with ferritin of 2, she has received Feraheme x 1 dose, getting second one today. Feels overall better, no bleeding since last time. Missed her ultrasound appointment, will be rescheduled.  She denies any abnormal vaginal discharge, bleeding, pelvic pain or other concerns.    Past Medical History:  Diagnosis Date  . 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 Hx LEEP (loop electrosurgical excision procedure), cervix, pregnancy 2009    Past Surgical History:  Procedure Laterality Date  . EXCISION OF KELOID     twice.  Dr. Towanda Malkin, plastic surgery   The following portions of the patient's history were reviewed and updated as appropriate: allergies, current medications, past family history, past medical history, past social history, past surgical history and problem list.   Health Maintenance:  Normal pap and negative HRHPV on 04/21/2020.  Mammogram scheduled for 06/05/2020.  Review of Systems:  Pertinent items noted in HPI and remainder of comprehensive ROS otherwise negative.  Physical Exam:  BP 130/72   Pulse 89   Ht 5\' 5"  (1.651 m)   Wt 199 lb (90.3 kg)   LMP 05/14/2020 (Exact Date)   BMI 33.12 kg/m  CONSTITUTIONAL: Well-developed, well-nourished female in no acute distress.  SKIN: No rash noted. Not diaphoretic. No erythema. No pallor. MUSCULOSKELETAL: Normal range of motion. No edema noted. NEUROLOGIC: Alert and oriented to person, place, and time. Normal muscle tone coordination. No cranial nerve deficit noted. PSYCHIATRIC: Normal mood and affect. Normal behavior. Normal judgment and thought content. CARDIOVASCULAR: Normal heart rate noted RESPIRATORY: Effort and breath sounds normal, no problems with respiration noted ABDOMEN: No masses noted. No other overt distention noted.   PELVIC:  Deferred   Labs: CBC Latest Ref Rng & Units 04/21/2020 11/02/2017 10/27/2017  WBC 3.4 - 10.8 x10E3/uL 6.2 6.9 4.1  Hemoglobin 11.1 - 15.9 g/dL 7.7(L) 10.2(L) 6.8(LL)  Hematocrit 34.0 - 46.6 % 26.7(L) 30.1(L) 24.0(L)  Platelets 150 - 450 x10E3/uL [phone]      Assessment and Plan:      1. Menorrhagia with regular cycle  2. Fibroids 3. Iron deficiency anemia due to chronic blood loss Proceed with iron infusion as scheduled, bleeding precautions recommended Follow up after ultrasound, will then decide on appropriate management.  Routine preventative health maintenance measures emphasized. Please refer to After Visit Summary for other counseling recommendations.   Return in about 2 weeks (around 05/29/2020) for Followup after ultrasound to discuss results, further manageent.    I spent 12 minutes dedicated to the care of this patient including pre-visit review of records, face to face time with the patient discussing her conditions and treatments and post visit ordering of testing.    Verita Schneiders, MD, [place] for [employer], [place]

## 2020-05-15 NOTE — Patient Instructions (Signed)
Return to clinic for any scheduled appointments or for any gynecologic concerns as needed.   

## 2020-05-28 ENCOUNTER — Telehealth: Payer: Self-pay | Admitting: Family Medicine

## 2020-05-28 NOTE — Telephone Encounter (Signed)
Patient is calling requesting a Ultra Sound

## 2020-05-29 ENCOUNTER — Ambulatory Visit: Payer: Medicaid Other | Admitting: Obstetrics & Gynecology

## 2020-06-02 ENCOUNTER — Telehealth: Payer: Self-pay | Admitting: Clinical

## 2020-06-02 NOTE — Telephone Encounter (Signed)
Pt is requesting the following:   1. Pt says she needs [facility] financial paperwork, and wants to know if it can be sent to her, as she has [employer] only.   2. Pt says she was supposed to have an ultrasound and then follow up with Dr Harolyn Rutherford in 2 weeks, but it's now over two weeks and ultrasound has not been scheduled. Pt requests advice about what to do to get ultrasound scheduled.   3. Pt wants to know if there is a paper she needs to take with her when she goes to the mammogram appointment, as she had to take with her at her last mammogram.   [facility]  Admin staff has been informed that pt needs [facility] financial paperwork; [facility] clinical staff has been informed about pt's concerns regarding no ultrasound scheduled, as well as mammogram question.   Pt is aware that she may contact [place] at [phone] if any further questions arise before 5:30pm today, or if her questions are not answered.

## 2020-06-04 ENCOUNTER — Telehealth: Payer: Self-pay | Admitting: General Practice

## 2020-06-04 NOTE — Telephone Encounter (Signed)
Scheduled ultrasound 12/30 @ 3pm. Called and informed patient. Patient verbalized understanding. Asked patient about her question about mammogram appt. She states she doesn't remember her question but it was something about a paper. Asked patient if she meant the mammogram scholarship application and she states yes. Told patient she will need to come by the office to complete the paperwork for that. Patient verbalized understanding & states she will come by on Monday. Patient had no questions.

## 2020-06-04 NOTE — Telephone Encounter (Signed)
-----   Message from Garlan Fair, [place] sent at 06/02/2020  3:51 PM EST ----- Pt has a few questions:   1. Was supposed to f/u with Dr Harolyn Rutherford in two weeks to discuss ultrasound results, but she has no ultrasound scheduled. Has order been put in for ultrasound yet? Will she receive a call about this? It has been over 2 weeks, and she has heard nothing.   2. She is supposed to get a mammogram, but uncertain if there is a paper she needs to bring with her, as she did last time. (Scheduled for 1/27th)  (I'll send a second message to front desk to send her [facility] financial paperwork, in case she asks)

## 2020-06-05 ENCOUNTER — Ambulatory Visit: Payer: Medicaid Other

## 2020-06-12 ENCOUNTER — Ambulatory Visit
Admission: RE | Admit: 2020-06-12 | Discharge: 2020-06-12 | Disposition: A | Discharge status: Home / Self Care | Payer: Medicaid Other | Source: Ambulatory Visit | Attending: Obstetrics & Gynecology | Admitting: Obstetrics & Gynecology

## 2020-06-12 ENCOUNTER — Other Ambulatory Visit: Payer: Self-pay

## 2020-06-12 DIAGNOSIS — N920 Excessive and frequent menstruation with regular cycle: Secondary | ICD-10-CM | POA: Insufficient documentation

## 2020-06-12 DIAGNOSIS — D219 Benign neoplasm of connective and other soft tissue, unspecified: Secondary | ICD-10-CM | POA: Insufficient documentation

## 2020-06-17 ENCOUNTER — Telehealth: Payer: Self-pay | Admitting: General Practice

## 2020-06-17 NOTE — Telephone Encounter (Signed)
Copied from CRM [id number]. Topic: General - Other >> Jun 16, 2020  1:12 PM Gwenlyn Fudge wrote: Reason for CRM: PT called and is requesting to set up an appt with Mikle Bosworth regarding an orange card. Please advise.   Did not see where patient had any appointments with community care clinics. Please follow up.

## 2020-06-18 NOTE — Telephone Encounter (Signed)
I return Pt call, LVM inform her that she need to be an stablish Pt at the clinic to apply for the [facility] financial program and the [employer], also the [facility] program does NOT cover medical bills like hospital, Xray or specialist only the [facility] program but to apply she need to be a Pt of the clinic first

## 2020-07-11 ENCOUNTER — Ambulatory Visit: Payer: Medicaid Other

## 2021-06-04 ENCOUNTER — Encounter: Payer: Self-pay | Admitting: Obstetrics and Gynecology

## 2021-06-04 ENCOUNTER — Ambulatory Visit (INDEPENDENT_AMBULATORY_CARE_PROVIDER_SITE_OTHER): Payer: Medicaid Other | Admitting: Obstetrics and Gynecology

## 2021-06-04 ENCOUNTER — Other Ambulatory Visit: Payer: Self-pay

## 2021-06-04 VITALS — BP 138/87 | HR 86 | Ht 66.0 in | Wt 212.9 lb

## 2021-06-04 DIAGNOSIS — N912 Amenorrhea, unspecified: Secondary | ICD-10-CM | POA: Diagnosis not present

## 2021-06-04 DIAGNOSIS — Z01419 Encounter for gynecological examination (general) (routine) without abnormal findings: Secondary | ICD-10-CM | POA: Diagnosis not present

## 2021-06-04 IMAGING — US US PELVIS COMPLETE WITH TRANSVAGINAL
1 series · 15 of 25 positions shown · non-contrast
Comparison: Ultrasound 11/02/2017

CLINICAL DATA: Abnormal uterine bleeding

EXAM:
TRANSABDOMINAL AND TRANSVAGINAL ULTRASOUND OF PELVIS
TECHNIQUE: Both transabdominal and transvaginal ultrasound examinations of the
pelvis were performed. Transabdominal technique was performed for
global imaging of the pelvis including uterus, ovaries, adnexal
regions, and pelvic cul-de-sac. It was necessary to proceed with
endovaginal exam following the transabdominal exam to visualize the
uterus endometrium ovaries.

[Series 1: us pelvis complete with transvaginal · 15 of 73 slices shown]
[im 1/73]
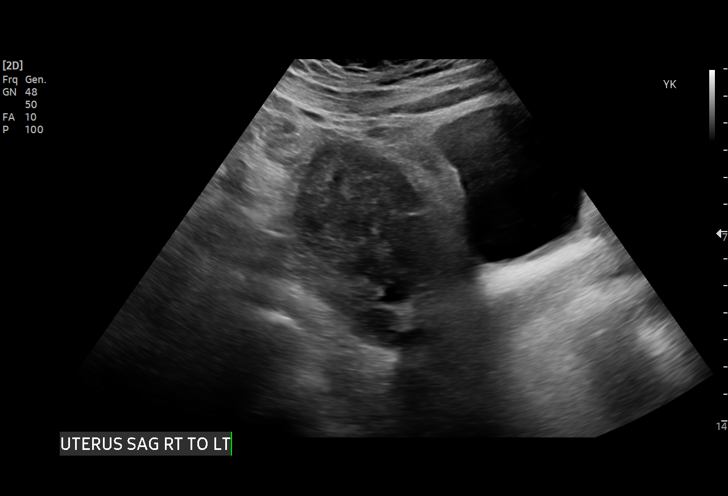
[im 7/73]
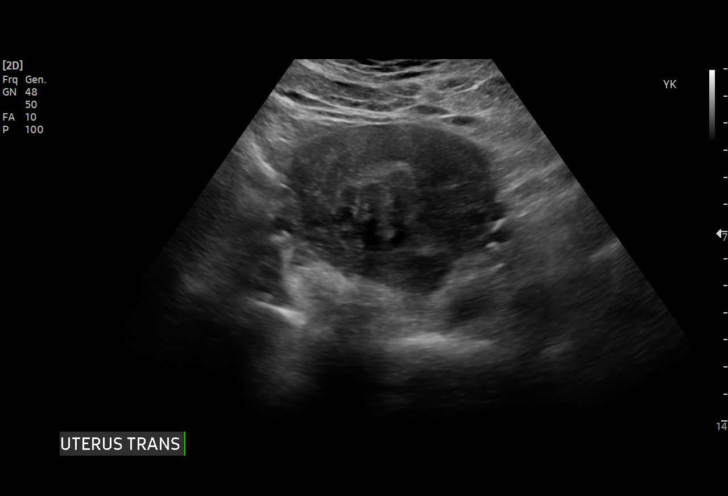
[im 13/73]
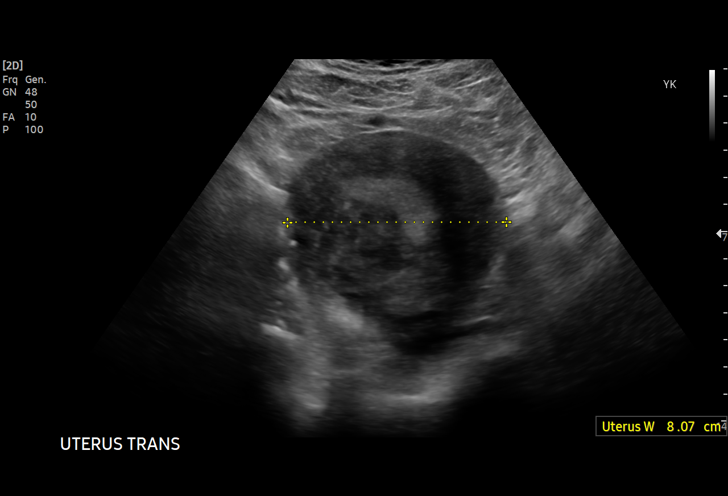
[im 16/73]
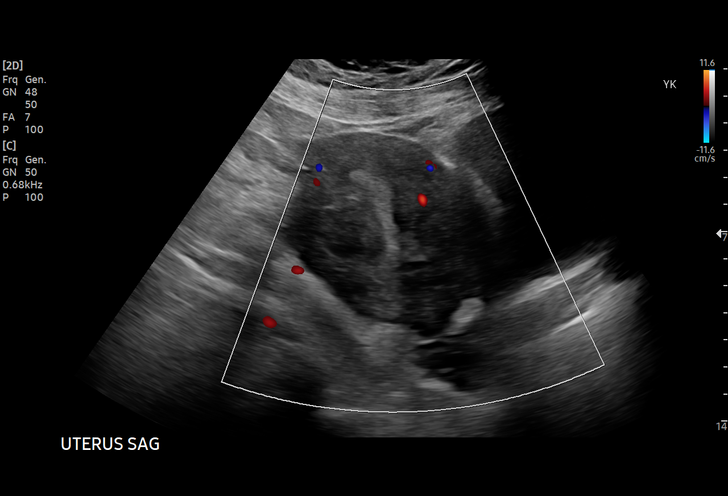
[im 22/73]
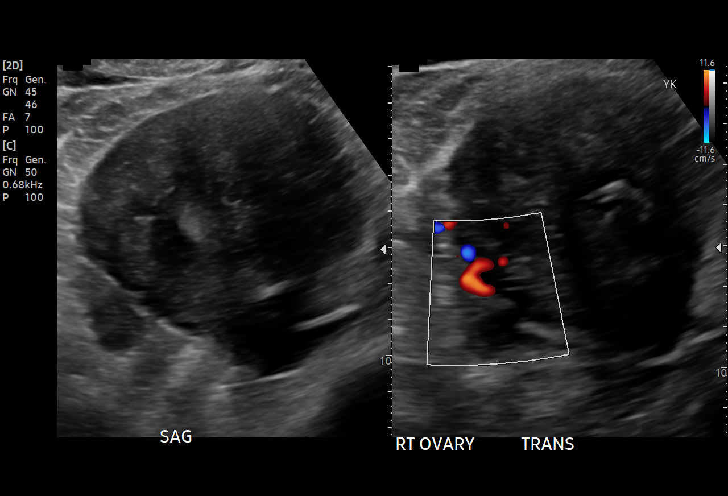
[im 28/73]
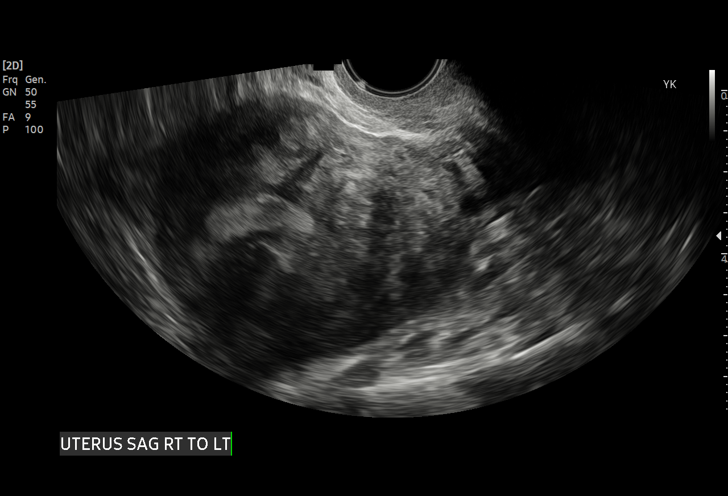
[im 31/73]
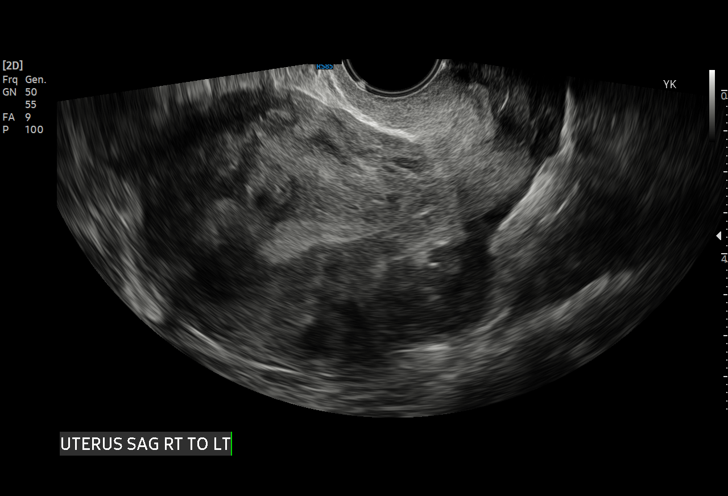
[im 37/73]
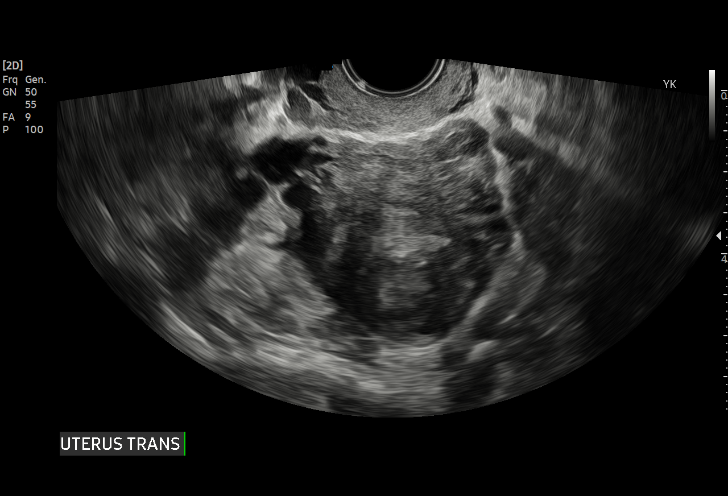
[im 43/73]
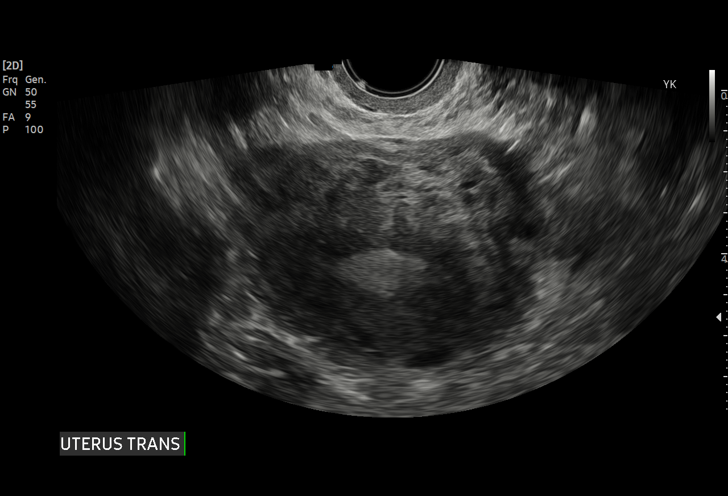
[im 46/73]
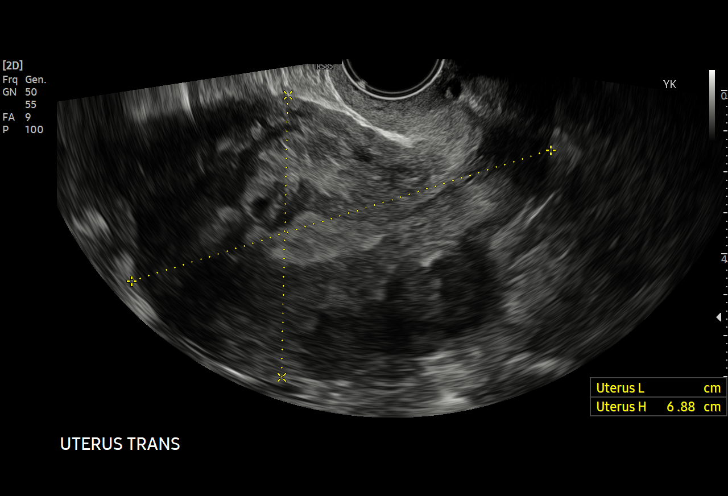
[im 52/73]
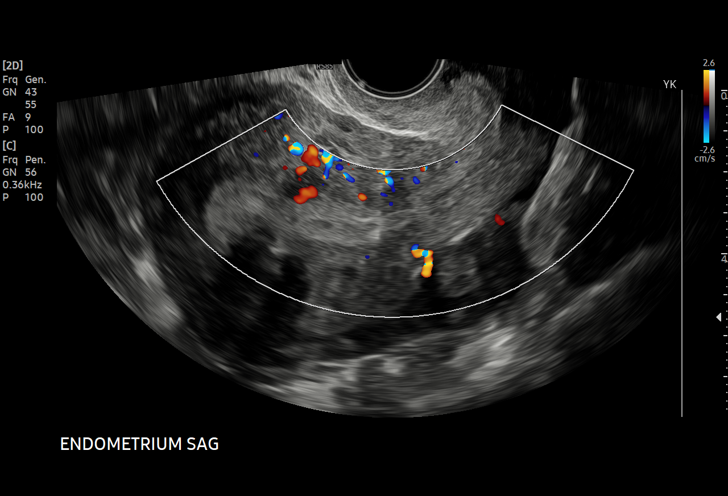
[im 58/73]
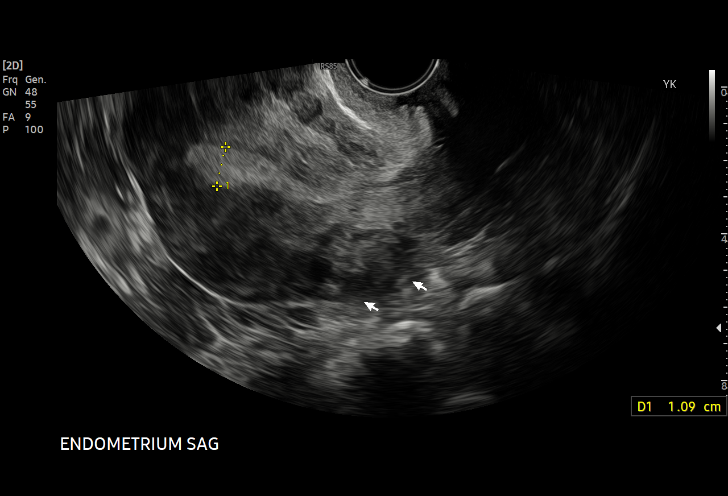
[im 61/73]
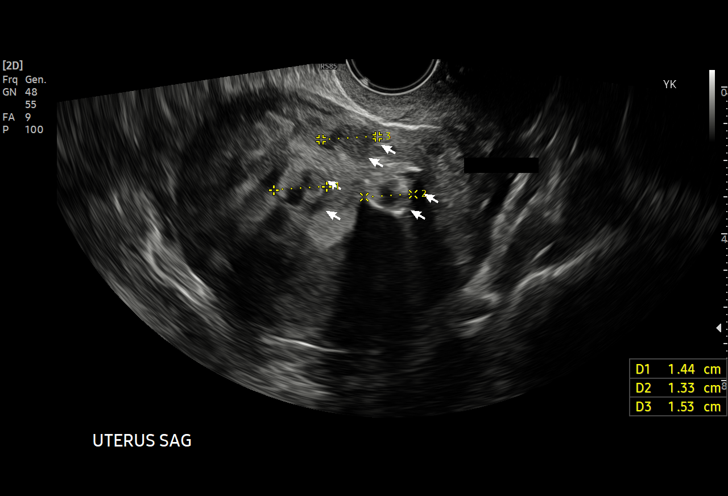
[im 67/73]
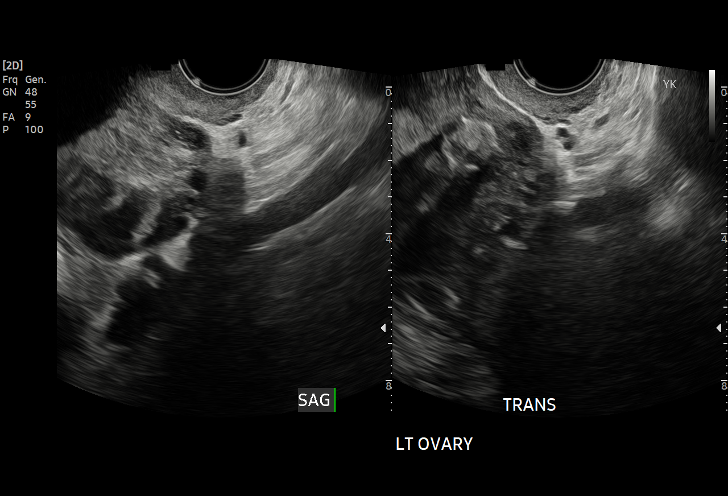
[im 73/73]
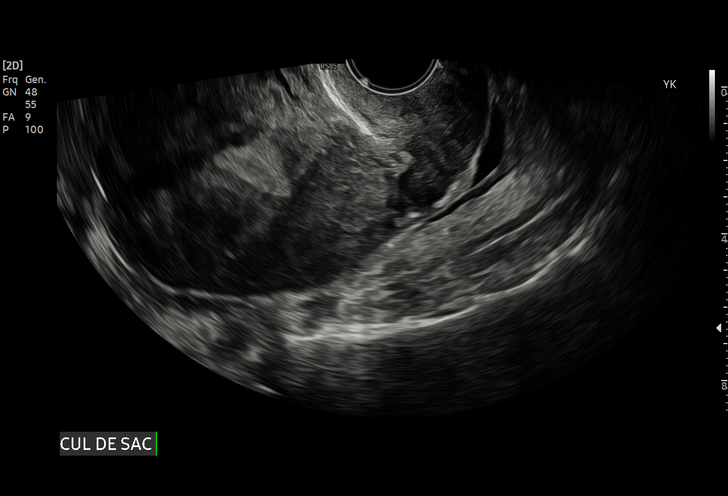

[15 of 25 positions shown; findings below may reference images not displayed]

FINDINGS: Uterus

Measurements: 11 x 7 x 8.2 cm = volume: 330 mL. Multiple uterine
fibroids, the largest most discrete fibroids are measured. Posterior
fundal fibroid measuring 3.4 x 3.8 x 3.4 cm, previously 2.8 x 2.3 x
2.8 cm. Posterior lower uterine segment fibroid measuring 3.5 x
x 2.1 cm, probably representing enlargement of previously measured
fibroid at 2.7 x 1.7 x 1.9 cm. Partially calcified anterior fibroid
measuring up to 1.5 cm with adjacent fibroids measuring up to
cm.

Endometrium

Thickness: 10.9 mm.  No focal abnormality visualized.

Right ovary

Measurements: 3 x 1.8 x 1.8 cm = volume: 3.3 mL. Normal
appearance/no adnexal mass.

Left ovary

Measurements: 2.1 by 1.4 x 1.3 cm = volume: 2 mL. Normal
appearance/no adnexal mass.

Other findings

No abnormal free fluid.
IMPRESSION: Enlarged fibroid uterus above. Normal premenopausal endometrial
thickness of 10.9 mm.

## 2021-06-04 NOTE — Progress Notes (Signed)
Obstetrics and Gynecology Annual Patient Evaluation  Appointment Date: 06/04/2021  OBGYN Clinic: Center for [facility]  Referring Provider: No ref. provider found  Chief Complaint:  Chief Complaint  Patient presents with   Gynecologic Exam    History of Present Illness: Christine Jimenez is a 50 y.o. African-American [id number] (No LMP recorded.), seen for the above chief complaint.   No period for past two months with hot flashes and night sweats.   Review of Systems: A comprehensive review of systems was negative.    Patient Active Problem List   Diagnosis Date Noted   Morbid obesity ([place]) 03/08/2018   Symptomatic anemia 11/01/2017   Iron deficiency anemia due to chronic blood loss 10/28/2017   Abnormal uterine bleeding (AUB) 07/26/2017   Keloid scar 01/26/2012   History of abnormal Pap smear 07/07/2011    Past Medical History:  Past Medical History:  Diagnosis Date   Abnormal Pap smear and cervical HPV (human papillomavirus)    Hx LEEP (loop electrosurgical excision procedure), cervix, pregnancy 2009    Past Surgical History:  Past Surgical History:  Procedure Laterality Date   EXCISION OF KELOID     twice.  Dr. Towanda Malkin, plastic surgery    Past Obstetrical History:  OB History  Gravida Para Term Preterm AB Living  6 6 6  0 0 6  SAB IAB Ectopic Multiple Live Births  0 0 0 0 6    # Outcome Date GA Lbr Len/2nd Weight Sex Delivery Anes PTL Lv  6 Term           5 Term           4 Term           3 Term           2 Term           1 Term             Past Gynecological History: As per HPI. History of Pap Smear(s): Yes.   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  Tobacco Use   Smoking status: Never   Smokeless tobacco: Never  Vaping Use   Vaping Use: Never used  Substance and Sexual Activity   Alcohol use: Yes    Comment: occassional    Drug use: No   Sexual activity: Yes    Birth control/protection: None    Comment: Depo-Provera  Other Topics Concern   Not on file  Social History Narrative   Born in [place].  Has lived in [place] as well.   Grew up in foster homes as mother signed her over to DSS when into legal trouble.   Lives alone, but 2 sons live in town.     Social Determinants of Health   Financial Resource Strain: Not on file  Food Insecurity: Not on file  Transportation Needs: Not on file  Physical Activity: Not on file  Stress: Not on file  Social Connections: Not on file  Intimate Partner Violence: Not on file    Family History:  Family History  Problem Relation Age of Onset   Diabetes Mother  Heart disease Mother    Hypertension Mother    Heart disease Father    Hypertension Father    She denies any female cancers  Medications None  Allergies Patient has no known allergies.  Physical Exam:  BP 138/87    Pulse 86    Ht 5\' 6"  (1.676 m)    Wt 212 lb 14.4 oz (96.6 kg)    BMI 34.36 kg/m  Body mass index is 34.36 kg/m.  General appearance: Well nourished, well developed female in no acute distress.  Neck:  Supple, normal appearance, and no thyromegaly  Cardiovascular: normal s1 and s2.  No murmurs, rubs or gallops. Respiratory:  Clear to auscultation bilateral. Normal respiratory effort Abdomen: positive bowel sounds and no masses, hernias; diffusely non tender to palpation, non distended Breasts: breasts appear normal, no suspicious masses, no skin or nipple changes or axillary nodes, and normal palpation. Neuro/Psych:  Normal mood and affect.  Skin:  Warm and dry.  Lymphatic:  No inguinal lymphadenopathy.   Pelvic exam: is limited by body habitus EGBUS: within normal  limits Vagina: within normal limits and with no blood or discharge in the vault Cervix: normal appearing cervix without tenderness, discharge or lesions.  Uterus:  nonenlarged and non tender Adnexa:  normal adnexa and no mass, fullness, tenderness Rectovaginal: deferred  Laboratory: none  Radiology: none  Assessment: pt doing well  Plan:  Hopefully in menopause. D/w her one year w/o menopause and potential peri-menopausal s/s to be on the look out for  Information for labcorp woman health panel to recheck cbc d/w her given she has FP medicaid because pt would like to see if her cbc is back to normal; pt seen for heavy periods and fibroids last year and was anemic. Pt is currently asymptomatic  [facility] referral for screening mammos sent  Information for going to HD to establish primary care also d/w her for colonoscopy screening.   RTC 1 year and PRN  Durene Romans MD Attending Center for [employer] [facility] [place])

## 2021-06-04 NOTE — Patient Instructions (Signed)
[email or web address]  For the labcorp blood test

## 2021-06-19 ENCOUNTER — Other Ambulatory Visit: Payer: Self-pay | Admitting: Obstetrics and Gynecology

## 2021-06-19 DIAGNOSIS — Z1231 Encounter for screening mammogram for malignant neoplasm of breast: Secondary | ICD-10-CM

## 2021-07-14 ENCOUNTER — Ambulatory Visit: Payer: Medicaid Other

## 2023-06-01 ENCOUNTER — Other Ambulatory Visit: Payer: Self-pay | Admitting: Family Medicine

## 2023-06-01 DIAGNOSIS — Z1231 Encounter for screening mammogram for malignant neoplasm of breast: Secondary | ICD-10-CM

## 2023-08-12 ENCOUNTER — Telehealth: Payer: Self-pay

## 2023-08-12 NOTE — Telephone Encounter (Signed)
 Left a message for the patient to call the office.

## 2023-09-07 ENCOUNTER — Other Ambulatory Visit: Payer: Self-pay | Admitting: Family Medicine

## 2023-09-07 DIAGNOSIS — Z1231 Encounter for screening mammogram for malignant neoplasm of breast: Secondary | ICD-10-CM

## 2024-01-19 ENCOUNTER — Institutional Professional Consult (permissible substitution)

## 2024-03-07 ENCOUNTER — Other Ambulatory Visit: Payer: Self-pay | Admitting: Family Medicine

## 2024-03-07 ENCOUNTER — Encounter: Payer: Self-pay | Admitting: Family Medicine

## 2024-03-07 DIAGNOSIS — Z1231 Encounter for screening mammogram for malignant neoplasm of breast: Secondary | ICD-10-CM

## 2024-03-21 ENCOUNTER — Ambulatory Visit

## 2024-03-21 VITALS — BP 136/89 | HR 77 | Ht 66.0 in | Wt 194.4 lb

## 2024-03-21 DIAGNOSIS — L910 Hypertrophic scar: Secondary | ICD-10-CM

## 2024-03-21 NOTE — Progress Notes (Signed)
 NAME: Christine Jimenez  MRN: [id number]  DOB: 1970-07-23    Referring physician:??No ref. provider found  PCP:?Patient, No Pcp Per    CHIEF COMPLAINT:?Keloid on bilateral ears   HPI:?  This is a 53 y.o. year old female with PMH and PSH as below who presents in consultation for keloid on bilateral ears.   It has been present for years. History of trauma and piercing in the area.  Is the keloid painful, itchy, or tender? The right sided keloid is tender  Has the size changed over time? They both have been growing in the last year   Previous treatments? (steroid injections, silicone sheets, surgery)? Resection of right ear and left ear keloids before.  Do any family members have a history of keloids or hypertrophic scars? Yes, there is family history of keloids.   Does the keloid affect your movement or cause discomfort? Right sided keloid.  Patient is a social smoker and HTN (Well controlled)  Patient denies h/o coagulopathies, or thyroid conditions. Not on any anticoagulation.     Family History:   Family history is negative for bleeding/clotting disorders, problems with anesthesia, connective tissue disorders.     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  Tobacco Use   Smoking status: Never   Smokeless tobacco: Never  Vaping Use   Vaping status: Never Used  Substance and Sexual Activity   Alcohol use: Yes    Comment: occassional    Drug use: No   Sexual activity: Yes    Birth control/protection: None    Comment: Depo-Provera   Other Topics Concern   Not on file  Social History Narrative   Born in [place].  Has lived in [place] as well.   Grew up in foster homes as mother signed her over to DSS when into legal trouble.    Lives alone, but 2 sons live in town.     Social Drivers of Corporate investment banker Strain: Not on file  Food Insecurity: Food Insecurity Present (05/15/2020)   Hunger Vital Sign    Worried About Running Out of Food in the Last Year: Never true    Ran Out of Food in the Last Year: Sometimes true  Transportation Needs: No Transportation Needs (05/15/2020)   PRAPARE - Administrator, Civil Service (Medical): No    Lack of Transportation (Non-Medical): No  Physical Activity: Not on file  Stress: Not on file  Social Connections: Not on file    Smoker/Vape Yes, patient does smoke  Recreational Drug use: Denies   PMH:  Past Medical History:  Diagnosis Date   Abnormal Pap smear and cervical HPV (human papillomavirus)    Hx LEEP (loop electrosurgical excision procedure), cervix, pregnancy 2009      PSH:  Past Surgical History:  Procedure Laterality Date   EXCISION OF KELOID     twice.  Dr. Marcus, plastic surgery      MEDICATIONS:?   Current Outpatient Medications:    losartan (COZAAR) 25 MG tablet, Take 25 mg by mouth daily., Disp: , Rfl:    sertraline (ZOLOFT) 50 MG tablet, Take 50 mg by mouth daily., Disp: , Rfl:     ALLERGIES:?  has no known allergies.    REVIEW OF SYSTEMS:  Review of Systems  ROS negative except as noted in HPI  VITALS:  Blood pressure 136/89, pulse 77, height 5' 6 (1.676 m), weight 194 lb 6.4 oz (88.2 kg), SpO2 96%.   BP 136/89   Pulse 77   Ht 5' 6 (1.676 m)   Wt 194 lb 6.4 oz (88.2 kg)   SpO2 96%   BMI 31.38 kg/m   Body mass index is 31.38 kg/m.  General: Well appearing, no apparent distress.  Neuro: A&Ox3 HEENT: Normocephalic, atraumatic, PERRL.  Keloid/s on bilateral ears right on lower 1/3 of helix and left on concha, both raised, shiny, firm, not erythematous, edges well-defined not spreading beyond the original wound, no ulceration, infection, or secondary changes.  Not fixed to underlying structures, mobile.     ASSESSMENT/PLAN  Assessment & Plan    Today we discussed the risks, benefits and alternatives to keloid excision. We discussed the alternatives which include continued observation; however, I told the patient that I do not believe these keloids will resolve on their own. We discussed non-surgical management including steroid and/or 5FU injection; however, I believe given the anatomy of the these lesions, it is very unlikely that they will resolve with intralesional injection. We then discussed the benefits of surgical excision which include complete removal of the lesion; however, I quoted the patient a 100% risk of recurrence without using adjuvant measures. We discussed the use of adjuvant steroids, and that the patient will require several cycles of postoperative intralesional injection allow for the best chance to avoid recurrence. We discussed the utility of adjuvant radiation; and I felt that an attempt at excision followed by adjuvant radiation would be appropriate. Even with all excisional, neoadjuvant and adjuvant therapies, we had a very transparent discussion on the high rate of recurrence. We discussed the risks of excision and adjuvant radiation therapy which include infection, bleeding, damage to surrounding healthy tissue and need for further surgery. We also discussed the risks of skin and fat atrophy, hypo- or hyperpigmentation, wound separation and recurrent abnormal scarring which includes the recurrence of keloids. We discussed the risks of anesthesia which will be further elaborated on by our anesthesia colleagues and the risks of radiation which will be further elaborated by our radiation oncology colleagues. The patient has a good understanding of all the risks and benefits, postoperative course and care. We obtained pictures and will give the patient quotes. All questions were answered.     The plan agreed upon includes: Recurrent right ear keloid excision, possible complex closure,  possible skin substitute + XRT x 3 days. Does not want surgey on left ear keloid for now. Will need a negative cotinine test before surgery.   I spent 30 minutes counseling the patient and discussing plan of care.  Tyce Delcid M Annabell [facility]

## 2024-04-03 NOTE — Addendum Note (Signed)
 Addended by: EUSTACIO POUR on: 04/03/2024 09:22 AM   Modules accepted: Orders

## 2024-04-09 ENCOUNTER — Telehealth: Payer: Self-pay | Admitting: Radiation Oncology

## 2024-04-09 NOTE — Telephone Encounter (Signed)
 LVM to scheduled consult with Dr. Izell

## 2024-04-10 ENCOUNTER — Telehealth: Payer: Self-pay | Admitting: Radiation Oncology

## 2024-04-10 NOTE — Telephone Encounter (Signed)
LVM to schedule consult with Dr. Squire ?

## 2024-04-12 ENCOUNTER — Telehealth: Payer: Self-pay | Admitting: Radiation Oncology

## 2024-04-12 NOTE — Telephone Encounter (Signed)
 Unable to LVM on pt's main number, LVM on secondary number, emergency contact number invalid.  Unable to contact letter mailed.

## 2024-04-16 ENCOUNTER — Telehealth: Payer: Self-pay | Admitting: Radiation Oncology

## 2024-04-16 NOTE — Telephone Encounter (Signed)
 Pt returned call and was able to get scheduled prior to procedure day to schedule initial consultation.

## 2024-04-16 NOTE — Telephone Encounter (Signed)
 Pt LVM returning all missed calls. Returned call this morning, no answer and unable to LVM.

## 2024-04-18 NOTE — Progress Notes (Incomplete)
 Histology and Location of Primary Skin Cancer:  Keloids on Bilateral Ears  Reena BIRCH Bittman presented with the following signs/symptoms,  {:19994} months ago: ***  Past/Anticipated interventions by patient's surgeon/dermatologist for current problematic lesion, if any: ***  History of Blistering sunburns, if any: {:18581}  SAFETY ISSUES: Prior radiation? {:18581} Pacemaker/ICD? {:18581} Possible current pregnancy? {:18581} Is the patient on methotrexate? {:18581}  Current Complaints / other details:  ***

## 2024-04-19 NOTE — Progress Notes (Signed)
 Radiation Oncology         ([phone] ________________________________  Initial Outpatient Consultation  Name: Christine Jimenez MRN: [id number]  Date: 04/20/2024  DOB: Jul 27, 1970  RR:[id number], No Pcp Per  Montorfano, Lisandro M,*   REFERRING PHYSICIAN: Montorfano, Lisandro M,*  DIAGNOSIS: No diagnosis found.   Cancer Staging  No matching staging information was found for the patient.   CHIEF COMPLAINT: Here to discuss management of bilateral ear keloids   HISTORY OF PRESENT ILLNESS::Christine Jimenez is a 53 y.o. female who presented to Dr. Montorfano at [facility] on 03/21/24 for evaluation of increasing bilateral ear keloids which had been present for years. She reported that both keloids were previously resected, and she endorsed some tenderness associated with the right ear keloid.   Treatment options per Dr. Montorfano include excision of the recurrent keloids, possible complex closure, and possible skin substitute + XRT x 3 days. The patient would rather avoid surgery to the left ear keloid at this time.   She is now scheduled to undergo excision/revision of the right ear keloid on 05/23/24.   ***  Photos obtained by Dr. Montorfano:       PREVIOUS RADIATION THERAPY: No  PAST MEDICAL HISTORY:  has a past medical history of Abnormal Pap smear and cervical HPV (human papillomavirus) and LEEP (loop electrosurgical excision procedure), cervix, pregnancy (2009).    PAST SURGICAL HISTORY: Past Surgical History:  Procedure Laterality Date   EXCISION OF KELOID     twice.  Dr. Marcus, plastic surgery    FAMILY HISTORY: family history includes Diabetes in her mother; Heart disease in her father and mother; Hypertension in her father and mother.  SOCIAL HISTORY:  reports that she has never smoked. She has never used smokeless tobacco. She reports current alcohol use. She reports that she does not use drugs.  ALLERGIES: Patient has no known  allergies.  MEDICATIONS:  Current Outpatient Medications  Medication Sig Dispense Refill   losartan (COZAAR) 25 MG tablet Take 25 mg by mouth daily.     sertraline (ZOLOFT) 50 MG tablet Take 50 mg by mouth daily.     No current facility-administered medications for this encounter.    REVIEW OF SYSTEMS:  Notable for that above.   PHYSICAL EXAM:  vitals were not taken for this visit.   General: Alert and oriented, in no acute distress *** HEENT: Head is normocephalic. Extraocular movements are intact. Oropharynx is clear. Neck: Neck is supple, no palpable cervical or supraclavicular lymphadenopathy. Heart: Regular in rate and rhythm with no murmurs, rubs, or gallops. Chest: Clear to auscultation bilaterally, with no rhonchi, wheezes, or rales. Abdomen: Soft, nontender, nondistended, with no rigidity or guarding. Extremities: No cyanosis or edema. Lymphatics: see Neck Exam Skin: No concerning lesions. Musculoskeletal: symmetric strength and muscle tone throughout. Neurologic: Cranial nerves II through XII are grossly intact. No obvious focalities. Speech is fluent. Coordination is intact. Psychiatric: Judgment and insight are intact. Affect is appropriate.   ECOG = ***  0 - Asymptomatic (Fully active, able to carry on all predisease activities without restriction)  1 - Symptomatic but completely ambulatory (Restricted in physically strenuous activity but ambulatory and able to carry out work of a light or sedentary nature. For example, light housework, office work)  2 - Symptomatic, <50% in bed during the day (Ambulatory and capable of all self care but unable to carry out any work activities. Up and about more than 50% of waking hours)  3 - Symptomatic, >  50% in bed, but not bedbound (Capable of only limited self-care, confined to bed or chair 50% or more of waking hours)  4 - Bedbound (Completely disabled. Cannot carry on any self-care. Totally confined to bed or chair)  5 -  Death   Raylene MM, Creech RH, Tormey DC, et al. [phone]). Toxicity and response criteria of the [facility]. Am. DOROTHA Bridges. Oncol. 5 (6): 649-55   LABORATORY DATA:  Lab Results  Component Value Date   WBC 6.2 04/21/2020   HGB 7.7 (L) 04/21/2020   HCT 26.7 (L) 04/21/2020   MCV 61 (L) 04/21/2020   PLT 413 04/21/2020   CMP     Component Value Date/Time   NA 135 04/21/2020 1028   K 4.1 04/21/2020 1028   CL 102 04/21/2020 1028   CO2 19 (L) 04/21/2020 1028   GLUCOSE 94 04/21/2020 1028   BUN 14 04/21/2020 1028   CREATININE 0.84 04/21/2020 1028   CALCIUM 9.0 04/21/2020 1028   PROT 7.8 04/21/2020 1028   ALBUMIN 4.5 04/21/2020 1028   AST 15 04/21/2020 1028   ALT 7 04/21/2020 1028   ALKPHOS 59 04/21/2020 1028   BILITOT 0.4 04/21/2020 1028   GFRNONAA 81 04/21/2020 1028         RADIOGRAPHY: No results found.    IMPRESSION/PLAN:***    On date of service, in total, I spent *** minutes on this encounter. Patient was seen in person.   __________________________________________   Lauraine Golden, MD  This document serves as a record of services personally performed by Lauraine Golden, MD. It was created on her behalf by Dorthy Fuse, a trained medical scribe. The creation of this record is based on the scribe's personal observations and the provider's statements to them. This document has been checked and approved by the attending provider.

## 2024-04-20 ENCOUNTER — Other Ambulatory Visit: Payer: Self-pay

## 2024-04-20 ENCOUNTER — Encounter: Payer: Self-pay | Admitting: Radiation Oncology

## 2024-04-20 ENCOUNTER — Ambulatory Visit
Admission: RE | Admit: 2024-04-20 | Discharge: 2024-04-20 | Disposition: A | Discharge status: Home / Self Care | Source: Ambulatory Visit | Attending: Radiation Oncology | Admitting: Radiation Oncology

## 2024-04-20 DIAGNOSIS — L910 Hypertrophic scar: Secondary | ICD-10-CM | POA: Diagnosis present

## 2024-04-20 DIAGNOSIS — Z79899 Other long term (current) drug therapy: Secondary | ICD-10-CM | POA: Diagnosis not present

## 2024-04-20 LAB — PREGNANCY, URINE: Preg Test, Ur: NEGATIVE

## 2024-04-24 ENCOUNTER — Inpatient Hospital Stay: Attending: Radiation Oncology

## 2024-04-24 NOTE — Progress Notes (Signed)
 [facility] Clinical Social Work  Clinical Social Work was referred by engineer, civil (consulting) for [facility] [facility] needs.  Clinical Social Worker contacted patient by phone to offer support and assess for needs.    Interventions: SDOH Interventions    Flowsheet Row Clinical Support from 04/24/2024 in [facility] [place]. [facility] Office Visit from [facility]/2020 in [facility]  SDOH Interventions    Food Insecurity Interventions [place] Resources Referral Food Market ([facility] only)  Housing Interventions Community Resources Provided, [place] Resource Referral --     Lizbeth Sprague, LCSW  Clinical Social Worker [facility]

## 2024-05-01 ENCOUNTER — Encounter: Admitting: Student

## 2024-05-03 ENCOUNTER — Ambulatory Visit: Admitting: Student

## 2024-05-03 ENCOUNTER — Encounter: Payer: Self-pay | Admitting: Student

## 2024-05-03 VITALS — BP 144/90 | HR 77 | Wt 200.0 lb

## 2024-05-03 DIAGNOSIS — Z87891 Personal history of nicotine dependence: Secondary | ICD-10-CM

## 2024-05-03 DIAGNOSIS — L910 Hypertrophic scar: Secondary | ICD-10-CM | POA: Diagnosis not present

## 2024-05-03 MED ORDER — ONDANSETRON HCL 4 MG PO TABS: 4.0000 mg | ORAL_TABLET | Freq: Three times a day (TID) | ORAL | 0 refills | Status: AC | PRN

## 2024-05-03 MED ORDER — OXYCODONE HCL 5 MG PO TABS: 5.0000 mg | ORAL_TABLET | Freq: Four times a day (QID) | ORAL | 0 refills | Status: AC | PRN

## 2024-05-03 NOTE — Progress Notes (Signed)
 Patient ID: Christine Jimenez, female    DOB: [birth year], 53 y.o.   MRN: [id number]  Chief Complaint  Patient presents with   Pre-op Exam      ICD-10-CM   1. Smoking history  Z87.891 Nicotine screen, urine    2. Keloid scar  L91.0 Nicotine screen, urine       History of Present Illness: Christine Jimenez is a 53 y.o.  female  with a history of keloid.  She presents for preoperative evaluation for upcoming procedure, right ear keloid excision with possible complex closure, scheduled for 05/23/2024 with Dr. Montorfano.  The patient reports that she has never had anesthesia before.  Patient denies any history of cardiac disease.  She states that she does not take any blood thinners.  Patient reports that she used to smoke, but has not smoked since her initial consult.    Patient denies taking any birth control or hormone replacement.  She denies any history of greater than 3 miscarriages.  She denies any personal family history of blood clots or clotting diseases.  Patient denies any recent surgeries, traumas or infections.  She denies any history of stroke or heart attack.  She denies any history of Crohn's disease or ulcerative colitis, COPD or asthma.  She denies any history of cancer.  She denies any varicosities to her lower extremities.  She denies any recent fevers, chills or changes in her health.  Patient reports that she has her postoperative radiation appointments already set up.  Summary of Previous Visit: Patient was seen for initial consult by Dr. Montorfano on 03/21/2024.  At this visit, patient reported that she had a keloids to the bilateral ears present for many years.  There is history of trauma and piercing in the area.  Patient reported that the right sided keloid was tender and that they had both been growing over the past few years.  It was noted that patient had resection of the right ear and left ear keloids before.  Plan was to move forward with excision of recurrent right  ear keloid and possible complex closure and possible skin substitute and radiation therapy for 3 days.  Patient reported that she did not want surgery on the left ear keloid for now.  It was noted that patient would need a negative continent test before surgery.  Per chart review, it does not appear that she has had her urine continent test completed yet.  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  Will have her on light duty for at least 3 weeks.  Patient expressed understanding.  PMH Significant for: Keloids   Past Medical History: Allergies: No Known Allergies  Current Medications:  Current Outpatient Medications:    losartan (COZAAR) 25 MG tablet, Take 25 mg by mouth daily., Disp: , Rfl:    sertraline (ZOLOFT) 50 MG tablet, Take 50 mg by mouth daily., Disp: , Rfl:   Past Medical Problems: Past Medical History:  Diagnosis Date   Abnormal Pap smear and cervical HPV (human papillomavirus)    Hx LEEP (loop electrosurgical excision procedure), cervix, pregnancy 2009    Past Surgical History: Past Surgical History:  Procedure Laterality Date   EXCISION OF KELOID     twice.  Dr. 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  Tobacco Use   Smoking status: Never   Smokeless tobacco: Never  Vaping Use   Vaping status: Never Used  Substance and Sexual Activity   Alcohol use: Yes    Comment: occassional    Drug use: No   Sexual activity: Yes    Birth control/protection: None    Comment: Depo-Provera   Other Topics Concern   Not on file  Social History Narrative   Born in [place].  Has lived in [place] as well.   Grew  up in foster homes as mother signed her over to DSS when into legal trouble.   Lives alone, but 2 sons live in town.     Social Drivers of Corporate Investment Banker Strain: Not on file  Food Insecurity: Food Insecurity Present (04/20/2024)   Hunger Vital Sign    Worried About Running Out of Food in the Last Year: Sometimes true    Ran Out of Food in the Last Year: Often true  Transportation Needs: No Transportation Needs (04/20/2024)   PRAPARE - Administrator, Civil Service (Medical): No    Lack of Transportation (Non-Medical): No  Physical Activity: Not on file  Stress: Not on file  Social Connections: Not on file  Intimate Partner Violence: Not At Risk (04/20/2024)   Humiliation, Afraid, Rape, and Kick questionnaire    Fear of Current or Ex-Partner: No    Emotionally Abused: No    Physically Abused: No    Sexually Abused: No    Family History: Family History  Problem Relation Age of Onset   Diabetes Mother    Heart disease Mother    Hypertension Mother    Heart disease Father    Hypertension Father     Review of Systems: Denies any recent fevers, chills or changes in her health  Physical Exam: Vital Signs BP (!) 144/90 (BP Location: Left Arm, Patient Position: Sitting, Cuff Size: Normal) Comment: This is not typical for her. Pt just got off work and rushed to her preop.  Pulse 77   Wt 200 lb (90.7 kg)   SpO2 92%   BMI (P) 32.28 kg/m   Physical Exam  Constitutional:      General: Not in acute distress.    Appearance: Normal appearance. Not ill-appearing.  HENT:     Head: Normocephalic and atraumatic.  Neck:     Musculoskeletal: Normal range of motion.  Cardiovascular:     Rate and Rhythm: Normal rate    Pulses: Normal pulses.  Pulmonary:     Effort: Pulmonary effort is normal. No respiratory distress.  Musculoskeletal: Normal range of motion.  Skin:    General: Skin is warm and dry.     Findings: No erythema or rash.  Neurological:      Mental Status: Alert and oriented to person, place, and time. Mental status is at baseline.  Psychiatric:        Mood and Affect: Mood normal.        Behavior: Behavior normal.    Assessment/Plan: The patient is scheduled for right ear keloid excision with possible complex closure with Dr. Montorfano.  Risks, benefits, and alternatives of procedure discussed, questions answered and consent obtained.    Smoking Status: Patient reports that she quit smoking several  weeks ago; Counseling Given?  Yes, discussed with the patient the importance of continuing with smoking cessation.  Discussed with her I would like her to go ahead and get her Laurena and test done as soon as possible.  She expressed understanding.  Caprini Score: 4; Risk Factors include: Age, BMI > 25, and length of planned surgery. Recommendation for mechanical prophylaxis. Encourage early ambulation.   Pictures obtained: @consult   Post-op Rx sent to pharmacy: Oxycodone, Zofran  Instructed patient to hold her losartan the day of surgery.  She expressed understanding.  Patient was provided with the General Surgical Risk consent document and Pain Medication Agreement prior to their appointment.  They had adequate time to read through the risk consent documents and Pain Medication Agreement. We also discussed them in person together during this preop appointment. All of their questions were answered to their satisfaction.  Recommended calling if they have any further questions.  Risk consent form and Pain Medication Agreement to be scanned into patient's chart.  The consent was obtained with risks and complications reviewed which included bleeding, pain, scar, infection and the risk of anesthesia.  The patients questions were answered to the patients expressed satisfaction.    Electronically signed by: Estefana FORBES Peck, PA-C 05/03/2024 3:24 PM

## 2024-05-23 ENCOUNTER — Ambulatory Visit: Admitting: Radiation Oncology

## 2024-05-23 ENCOUNTER — Ambulatory Visit

## 2024-05-23 ENCOUNTER — Ambulatory Visit (HOSPITAL_BASED_OUTPATIENT_CLINIC_OR_DEPARTMENT_OTHER): Admission: RE | Admit: 2024-05-23 | Source: Home / Self Care

## 2024-05-23 SURGERY — REVISION, SCAR
Anesthesia: Choice | Site: Ear | Laterality: Right

## 2024-05-24 ENCOUNTER — Ambulatory Visit

## 2024-05-25 ENCOUNTER — Ambulatory Visit

## 2024-05-28 ENCOUNTER — Ambulatory Visit

## 2024-06-01 ENCOUNTER — Encounter

## 2024-06-06 ENCOUNTER — Encounter

## 2024-06-15 ENCOUNTER — Encounter: Admitting: Student

## 2024-07-19 ENCOUNTER — Encounter: Payer: Self-pay | Admitting: Gastroenterology

## 2024-08-17 ENCOUNTER — Encounter
